# Patient Record
Sex: Female | Born: 1965
Health system: Southern US, Community
[De-identification: ages and names within clinical notes are randomized; demographics above are authoritative.]

## PROBLEM LIST (undated history)

## (undated) DIAGNOSIS — I10 Essential (primary) hypertension: Secondary | ICD-10-CM

## (undated) DIAGNOSIS — K219 Gastro-esophageal reflux disease without esophagitis: Secondary | ICD-10-CM

## (undated) DIAGNOSIS — E039 Hypothyroidism, unspecified: Secondary | ICD-10-CM

## (undated) DIAGNOSIS — F419 Anxiety disorder, unspecified: Secondary | ICD-10-CM

## (undated) DIAGNOSIS — E785 Hyperlipidemia, unspecified: Secondary | ICD-10-CM

## (undated) HISTORY — PX: BLADDER SURGERY: SHX569

## (undated) HISTORY — DX: Anxiety disorder, unspecified: F41.9

## (undated) HISTORY — DX: Essential (primary) hypertension: I10

## (undated) HISTORY — DX: Hypothyroidism, unspecified: E03.9

## (undated) HISTORY — DX: Gastro-esophageal reflux disease without esophagitis: K21.9

## (undated) HISTORY — DX: Hyperlipidemia, unspecified: E78.5

## (undated) HISTORY — PX: CHOLECYSTECTOMY: SHX55

---

## 2003-05-17 ENCOUNTER — Other Ambulatory Visit: Payer: Self-pay

## 2003-06-07 ENCOUNTER — Other Ambulatory Visit: Payer: Self-pay

## 2003-06-16 ENCOUNTER — Other Ambulatory Visit: Payer: Self-pay

## 2005-01-11 ENCOUNTER — Ambulatory Visit: Payer: Self-pay | Admitting: Unknown Physician Specialty

## 2005-07-26 ENCOUNTER — Ambulatory Visit: Payer: Self-pay | Admitting: Unknown Physician Specialty

## 2007-05-31 ENCOUNTER — Emergency Department: Payer: Self-pay | Admitting: Emergency Medicine

## 2008-11-27 ENCOUNTER — Emergency Department: Payer: Self-pay | Admitting: Emergency Medicine

## 2009-03-31 ENCOUNTER — Ambulatory Visit: Payer: Self-pay | Admitting: Family Medicine

## 2010-01-11 ENCOUNTER — Emergency Department: Payer: Self-pay | Admitting: Emergency Medicine

## 2010-03-18 ENCOUNTER — Emergency Department: Payer: Self-pay | Admitting: Emergency Medicine

## 2010-04-30 ENCOUNTER — Ambulatory Visit: Payer: Self-pay | Admitting: Unknown Physician Specialty

## 2010-05-02 ENCOUNTER — Ambulatory Visit: Payer: Self-pay | Admitting: Unknown Physician Specialty

## 2010-05-11 ENCOUNTER — Ambulatory Visit: Payer: Self-pay | Admitting: Unknown Physician Specialty

## 2010-05-11 LAB — HM COLONOSCOPY

## 2011-08-26 ENCOUNTER — Ambulatory Visit: Payer: Self-pay | Admitting: Oncology

## 2011-08-26 LAB — IRON AND TIBC
Iron Bind.Cap.(Total): 441 ug/dL (ref 250–450)
Iron Saturation: 13 %
Iron: 57 ug/dL (ref 50–170)
Unbound Iron-Bind.Cap.: 384 ug/dL

## 2011-08-26 LAB — CBC CANCER CENTER
Basophil #: 0 x10 3/mm (ref 0.0–0.1)
Basophil %: 0.3 %
Eosinophil %: 2 %
HCT: 34.6 % — ABNORMAL LOW (ref 35.0–47.0)
HGB: 11.5 g/dL — ABNORMAL LOW (ref 12.0–16.0)
Lymphocyte #: 2.2 x10 3/mm (ref 1.0–3.6)
Lymphocyte %: 27.7 %
Monocyte #: 0.5 x10 3/mm (ref 0.2–0.9)
Monocyte %: 6.7 %
Platelet: 238 x10 3/mm (ref 150–440)
RBC: 3.8 10*6/uL (ref 3.80–5.20)
RDW: 16.1 % — ABNORMAL HIGH (ref 11.5–14.5)
WBC: 7.9 x10 3/mm (ref 3.6–11.0)

## 2011-08-26 LAB — LACTATE DEHYDROGENASE: LDH: 171 U/L (ref 84–246)

## 2011-08-26 LAB — RETICULOCYTES: Absolute Retic Count: 0.0835 10*6/uL (ref 0.024–0.084)

## 2011-09-14 ENCOUNTER — Ambulatory Visit: Payer: Self-pay | Admitting: Oncology

## 2012-03-27 IMAGING — CT CT ABD-PELV W/ CM
1 of 2 series · 14 of 32 positions shown, 18 images · non-contrast
Comparison: none

REASON FOR EXAM: Epigastric Pain Iron Deficiency Anemia
COMMENTS:

[Series 2: abd with 5.0 i40f · axial · 0.93mm/px · z∈[+419,+814]mm · 14 of 87 slices shown, 18 images]
[im 4/87  soft-tissue]
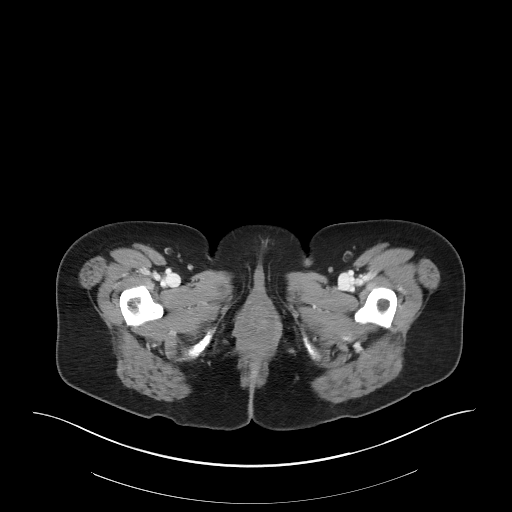
[im 4/87  bone]
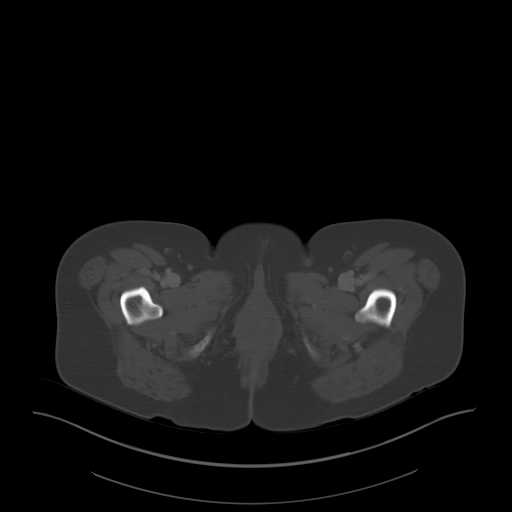
[im 11/87  soft-tissue]
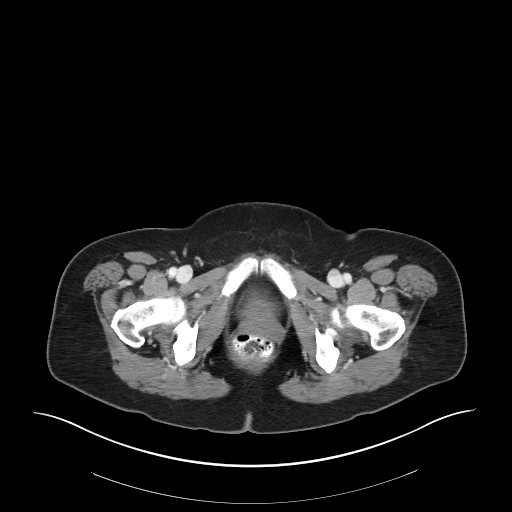
[im 18/87  soft-tissue]
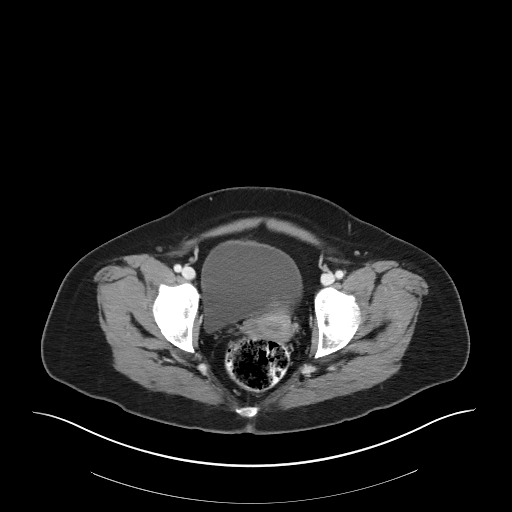
[im 26/87  soft-tissue]
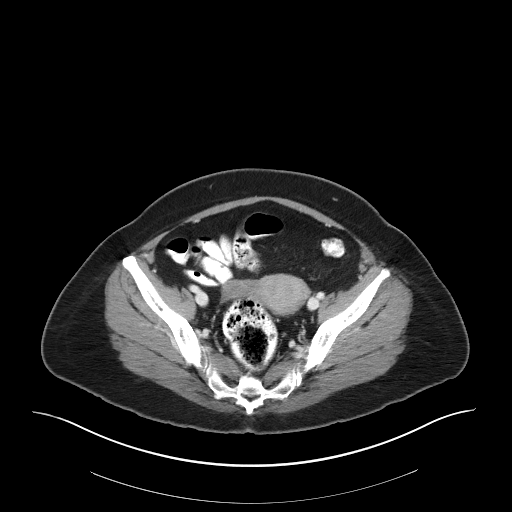
[im 33/87  soft-tissue]
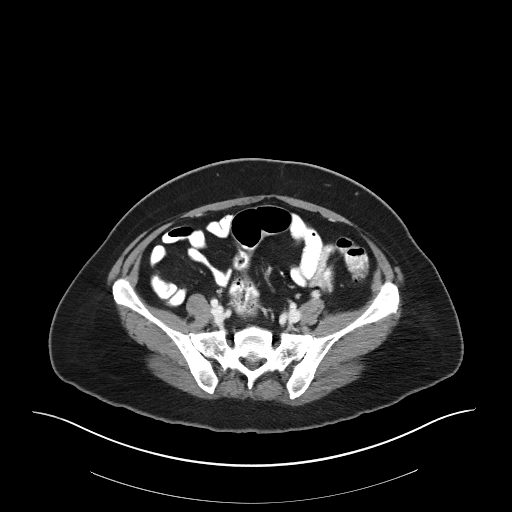
[im 40/87  soft-tissue]
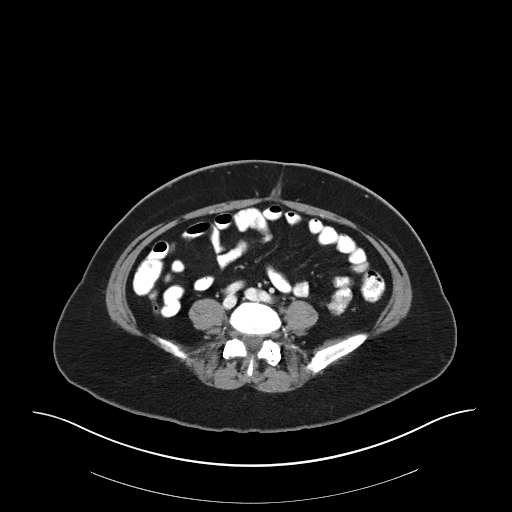
[im 47/87  soft-tissue]
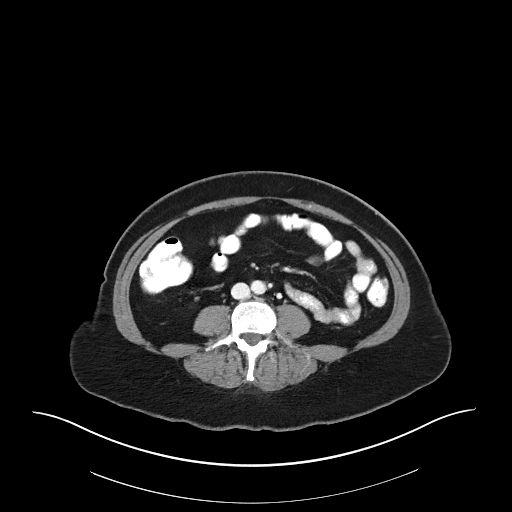
[im 54/87  soft-tissue]
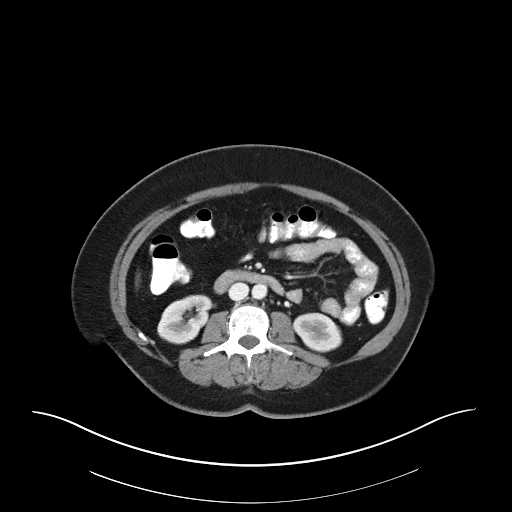
[im 61/87  soft-tissue]
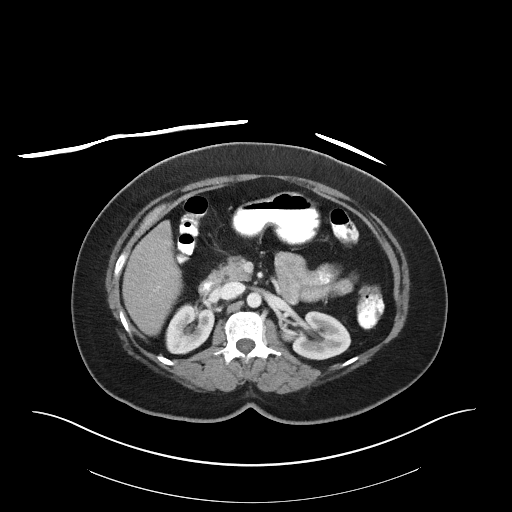
[im 61/87  bone]
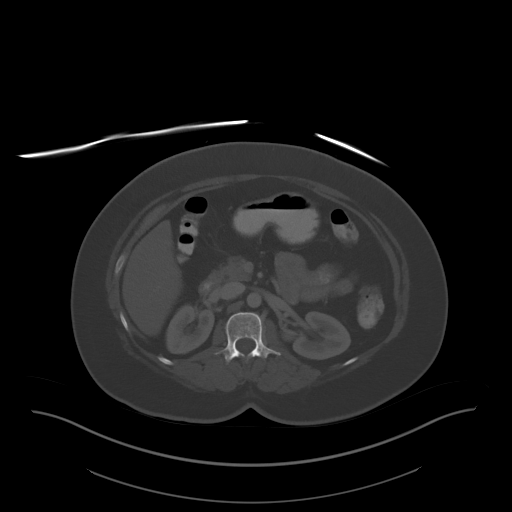
[im 69/87  soft-tissue]
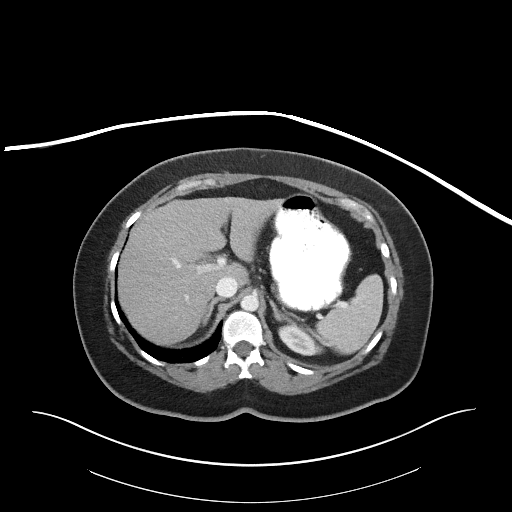
[im 72/87  lung]
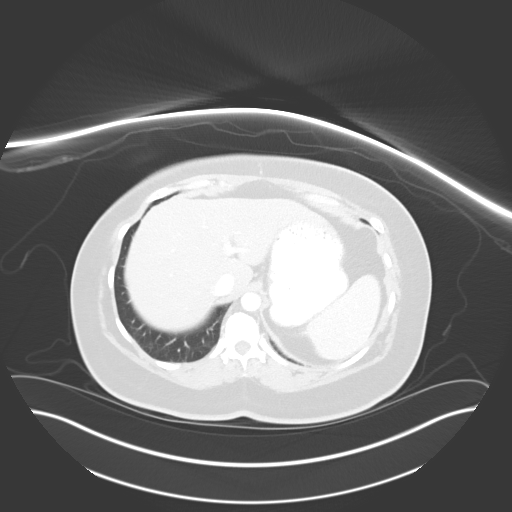
[im 76/87  soft-tissue]
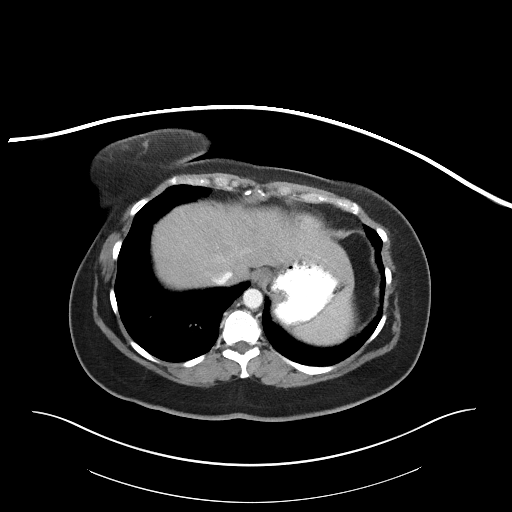
[im 76/87  lung]
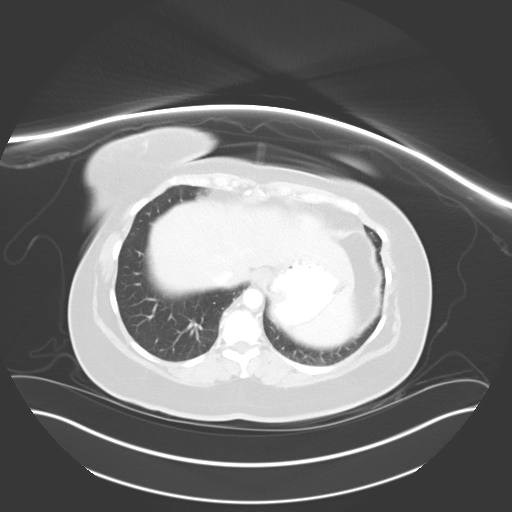
[im 79/87  lung]
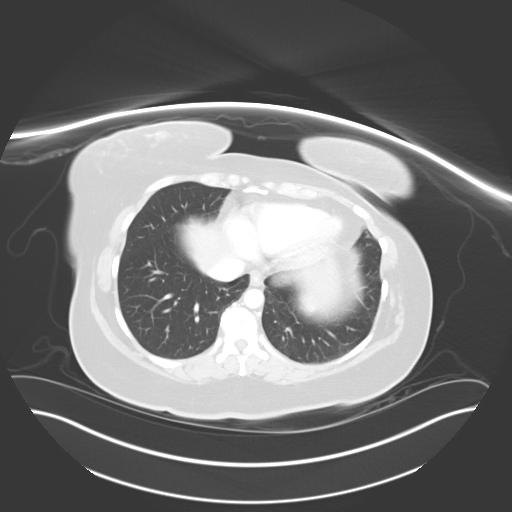
[im 83/87  soft-tissue]
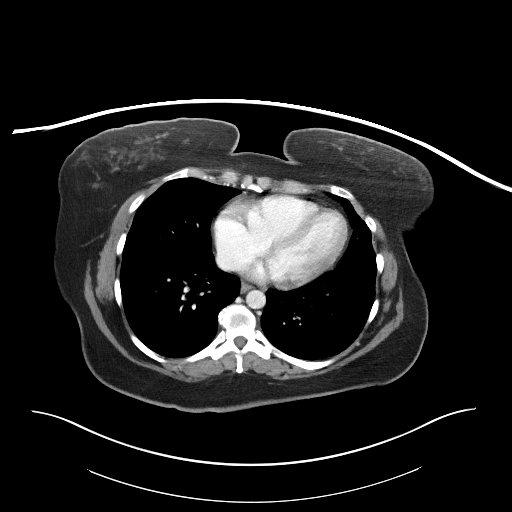
[im 83/87  lung]
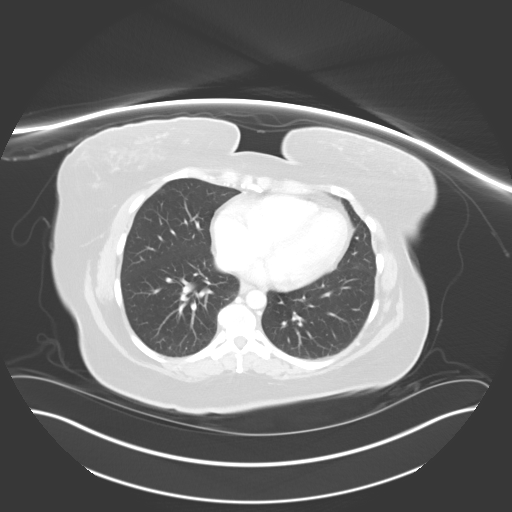

[14 of 32 positions shown; findings below may reference images not displayed]

PROCEDURE:     KCT - KCT ABDOMEN/PELVIS W  - May 02, 2010  [DATE]

RESULT:     Axial CT scanning was performed through the abdomen and pelvis
at 5 mm intervals and slice thicknesses following intravenous administration
of 85 cc of Xsovue-KZT as well as administration of oral contrast material.
Review of multiplanar reconstructed images was performed separately on the
VIA monitor. Comparison is made to a previous study 27 November, 2008.

The liver exhibits normal density with no focal mass nor ductal dilation.
The gallbladder is surgically absent. The spleen, stomach, pancreas, adrenal
glands, and kidneys are normal in appearance. The mesenteric fat exhibits
normal density. The caliber of the abdominal aorta is normal. I see no
periaortic nor pericaval lymphadenopathy. The partially contrast-filled
loops of small and large bowel are normal in appearance. There is a moderate
amount of stool in the rectosigmoid colon.

The uterus exhibits a previously described heterogeneous density mass
arising from the fundus. This is most compatible with a fibroid or group of
fibroids. There is likely an ovarian cyst on the right measuring
approximately 12 x 15 mm. This does not appear new when compared to the
previous study. I see no left adnexal cystic process. There is no free fluid
in the abdomen or pelvis. There is no inguinal nor umbilical hernia. There
is no inguinal lymphadenopathy. The lumbar vertebral bodies are preserved in
height. The lung bases are clear.
IMPRESSION: 1. I do not see evidence of acute hepatobiliary abnormality. The gallbladder
is surgically absent.
2. The stomach and small bowel exhibit no acute abnormality. There is a
moderate amount of stool in the rectosigmoid which may reflect the
clinically described constipation.
3. There is soft tissue fullness associated with the uterine fundus in the
midline and in the right adnexal region. These findings are not new. These
could be evaluated further with pelvic ultrasound. It likely reflect uterine
fibroids and a right adnexal cystic process.
3. I do not see evidence of acute urinary tract abnormality.
4. I see no intra-abdominal nor pelvic lymphadenopathy.

## 2013-01-13 ENCOUNTER — Ambulatory Visit: Payer: Self-pay | Admitting: Unknown Physician Specialty

## 2013-02-10 ENCOUNTER — Ambulatory Visit: Payer: Self-pay | Admitting: Gastroenterology

## 2013-03-03 ENCOUNTER — Ambulatory Visit: Payer: Self-pay | Admitting: Otolaryngology

## 2014-09-18 DIAGNOSIS — K219 Gastro-esophageal reflux disease without esophagitis: Secondary | ICD-10-CM | POA: Insufficient documentation

## 2014-10-07 ENCOUNTER — Encounter: Payer: BLUE CROSS/BLUE SHIELD | Attending: Family Medicine | Admitting: Dietician

## 2014-10-07 ENCOUNTER — Encounter: Payer: Self-pay | Admitting: Dietician

## 2014-10-07 VITALS — Ht 62.0 in | Wt 194.7 lb

## 2014-10-07 DIAGNOSIS — E669 Obesity, unspecified: Secondary | ICD-10-CM | POA: Diagnosis not present

## 2014-10-07 NOTE — Patient Instructions (Signed)
   Look for "0" trans fat on labels, and low Saturated fat. 10g sat. Fat daily or less  Check food labels for sodium, and aim for 600mg  or less with each meal.   Keep working to increase activity and exercise, start with 10 minutes of walking, and gradually increase as your energy increases.  Try using smaller plates to eat meals.  Relax before eating and eat slowly, try chewing each bite 15-20 times before taking another bite.   Increase vegetables -- make sure to include a vegetable with each meal -- such as carrots with a sandwich at lunch, and green beans or broccoli or salad with supper (try Romaine lettuce instead of iceberg, and small seedless cucumbers for easier digestion).

## 2014-10-07 NOTE — Progress Notes (Signed)
Medical Nutrition Therapy: Visit start time: 1100  end time: 1200  Assessment:  Diagnosis: obesity Past medical history: GERD, HTN Psychosocial issues/ stress concerns: patient reports anxiety, peri-menopausal mood changes Preferred learning method:  . Auditory . Visual  Current weight: 194.7lbs  Height: 5'2" Medications, supplements: updated list in chart Progress and evaluation: Patient reports weight gain of 10lbs in past several months. She reports frequent emotional eating and choosing unhealthy snacks over the past several years.         She would like to begin some exercise but feels fatigued and unmotivated when she has the opportunity to exercise.   Physical activity: none  Dietary Intake:  Usual eating pattern includes 3 meals and 2 snacks per day. Dining out frequency: 3 meals per week.  Breakfast: lactaid milk and special K or oatmeal, or graham crackers Snack: sometimes yogurt occasionally cookie Lunch: [place] sandwich with cheese, yogurt with small cup tropical fruit or banana Snack: usually none unless stressed with then eat "junk" ie chips or cookies. Has stopped buying those foods Supper: rice, tortillas, beans, not many vegetables -- larger meal making reflux worse, often eats seconds Snack: none Beverages: water (coffee, soda too acidic)  Nutrition Care Education: Topics covered: weight management, stress eating Basic nutrition: basic food groups, appropriate nutrient balance Weight control:  behavioral changes for weight loss; guide for 1300kcal meal plan with heart healthy food lists; strategies for portion control and snacking Advanced nutrition:  food label reading for Trans fat and saturated fat, sodium Hypertension:  identifying high sodium foods, identifying food sources potassium, magnesium Other lifestyle changes:  increasing motivation for exercise by starting with manageable level and gradually increasing  Nutritional Diagnosis:  Nichols-3.3  Overweight/obesity As related to excess caloric intake, inactivity.  As evidenced by emotional eating, large portions per pt, and BMI of 35.7.  Intervention: Discussion as noted above.    Commended pt for changes she has already made.    Encouraged her to implement strategies discussed to control portions of starches while increasing vegetable portions.   Discussed healthy snack options.    Provided simple tracking tool for her to monitor progress on goal achievement.  Education Materials given:  . Food lists/ Planning A Balanced Meal with 1300kcal plan . Recipes: Hispanic cookbook . Sample meal pattern/ menus: Quick and Healthy Meal Ideas . Goals/ instructions . Other Top 10 healthy diet changes (packet)  Learner/ who was taught:  . Patient   Level of understanding: Marland Kitchen Verbalizes/ demonstrates competency  Demonstrated degree of understanding via:   Teach back Learning barriers: . None  Willingness to learn/ readiness for change: . Eager, change in progress  Monitoring and Evaluation:  Dietary intake, exercise, goal achievement, and body weight      follow up: 11/18/14

## 2014-10-10 ENCOUNTER — Encounter: Payer: Self-pay | Admitting: *Deleted

## 2014-10-13 ENCOUNTER — Encounter: Payer: Self-pay | Admitting: Obstetrics and Gynecology

## 2014-11-08 ENCOUNTER — Ambulatory Visit (INDEPENDENT_AMBULATORY_CARE_PROVIDER_SITE_OTHER): Payer: BLUE CROSS/BLUE SHIELD | Admitting: Obstetrics and Gynecology

## 2014-11-08 ENCOUNTER — Encounter: Payer: Self-pay | Admitting: Obstetrics and Gynecology

## 2014-11-08 VITALS — BP 144/80 | HR 80 | Ht 62.0 in | Wt 200.0 lb

## 2014-11-08 DIAGNOSIS — Z01419 Encounter for gynecological examination (general) (routine) without abnormal findings: Secondary | ICD-10-CM | POA: Diagnosis not present

## 2014-11-08 NOTE — Patient Instructions (Signed)
Thank you for enrolling in MyChart. Please follow the instructions below to securely access your online medical record. MyChart allows you to send messages to your doctor, view your test results, renew your prescriptions, schedule appointments, and more.  How Do I Sign Up? 1. In your Internet browser, go to [email or web address] 2. Click on the New  User? link in the Sign In box.  3. Enter your MyChart Access Code exactly as it appears below. You will not need to use this code after you have completed the sign-up process. If you do not sign up before the expiration date, you must request a new code. MyChart Access Code: [email or web address] Expires: 12/06/2014 12:14 PM  4. Enter the last four digits of your Social Security Number (xxxx) and Date of Birth (mm/dd/yyyy) as indicated and click Next. You will be taken to the next sign-up page. 5. Create a MyChart ID. This will be your MyChart login ID and cannot be changed, so think of one that is secure and easy to remember. 6. Create a MyChart password. You can change your password at any time. 7. Enter your Password Reset Question and Answer and click Next. This can be used at a later time if you forget your password.  8. Select your communication preference, and if applicable enter your e-mail address. You will receive e-mail notification when new information is available in MyChart by choosing to receive e-mail notifications and filling in your e-mail. 9. Click Sign In. You can now view your medical record.   Additional Information If you have questions, you can email [email or web address] or call [phone] to talk to our MyChart staff. Remember, MyChart is NOT to be used for urgent needs. For medical emergencies, dial 911.

## 2014-11-08 NOTE — Progress Notes (Signed)
  Subjective:     Suzanne Griffin is a 49 y.o. female and is here for a comprehensive physical exam. The patient reports no problems.  History   Social History  . Marital Status: Married    Spouse Name: N/A  . Number of Children: N/A  . Years of Education: N/A   Occupational History  . Not on file.   Social History Main Topics  . Smoking status: Never Smoker   . Smokeless tobacco: Never Used  . Alcohol Use: No  . Drug Use: No  . Sexual Activity: Yes   Other Topics Concern  . Not on file   Social History Narrative   Health Maintenance  Topic Date Due  . HIV Screening  04/05/1979  . PAP SMEAR  04/04/1982  . TETANUS/TDAP  04/05/1983  . MAMMOGRAM  04/04/2014  . COLONOSCOPY  04/04/2014  . INFLUENZA VACCINE  11/14/2014    The following portions of the patient's history were reviewed and updated as appropriate: allergies, current medications, past family history, past medical history, past social history, past surgical history and problem list.  Review of Systems A comprehensive review of systems was negative except for: sporadic menses x 1 year- with LMP 05/2014   Objective:    General appearance: alert, cooperative, appears stated age and morbidly obese Neck: no adenopathy, no carotid bruit, no JVD, supple, symmetrical, trachea midline and thyroid not enlarged, symmetric, no tenderness/mass/nodules Lungs: clear to auscultation bilaterally Breasts: normal appearance, no masses or tenderness Heart: regular rate and rhythm, S1, S2 normal, no murmur, click, rub or gallop Abdomen: soft, non-tender; bowel sounds normal; no masses,  no organomegaly Pelvic: cervix normal in appearance, external genitalia normal, no adnexal masses or tenderness, no cervical motion tenderness, rectovaginal septum normal, uterus normal size, shape, and consistency and vagina normal without discharge    Assessment:    Healthy female exam. Obesity; premenopausal; HTN;      Plan:  Pap not  indicated Continue health maintenance and regular exercise MMG ordered RTC 1 year or prn   See After Visit Summary for Counseling Recommendations

## 2014-11-18 ENCOUNTER — Ambulatory Visit: Payer: BLUE CROSS/BLUE SHIELD | Admitting: Dietician

## 2014-12-07 ENCOUNTER — Encounter: Payer: Self-pay | Admitting: Dietician

## 2014-12-21 ENCOUNTER — Ambulatory Visit (INDEPENDENT_AMBULATORY_CARE_PROVIDER_SITE_OTHER): Payer: BLUE CROSS/BLUE SHIELD | Admitting: Family Medicine

## 2014-12-21 ENCOUNTER — Encounter (INDEPENDENT_AMBULATORY_CARE_PROVIDER_SITE_OTHER): Payer: Self-pay

## 2014-12-21 ENCOUNTER — Encounter: Payer: Self-pay | Admitting: Family Medicine

## 2014-12-21 VITALS — BP 140/79 | HR 88 | Temp 97.9°F | Resp 18 | Ht 62.0 in | Wt 198.9 lb

## 2014-12-21 DIAGNOSIS — Z23 Encounter for immunization: Secondary | ICD-10-CM

## 2014-12-21 DIAGNOSIS — E559 Vitamin D deficiency, unspecified: Secondary | ICD-10-CM | POA: Insufficient documentation

## 2014-12-21 NOTE — Progress Notes (Signed)
Name: Suzanne Griffin   MRN: [id number]    DOB: Jan 15, 1966   Date:12/21/2014       Progress Note  Subjective  Chief Complaint  Chief Complaint  Patient presents with  . Follow-up    Fasting - Labs  . Anxiety  . Hyperlipidemia  . Hypertension    HPI Pt. Is here to recheck Vitamin D levels today. Last check was in May 2016 and her levels was 22.6. She denies any muscle aches or fatigue.  Past Medical History  Diagnosis Date  . Anxiety   . Hypertension   . Hyperlipidemia   . Hypothyroidism   . GERD (gastroesophageal reflux disease)     Past Surgical History  Procedure Laterality Date  . Cholecystectomy    . Bladder surgery      Family History  Problem Relation Age of Onset  . Hypertension Father   . Diabetes Maternal Grandmother     Social History   Social History  . Marital Status: Married    Spouse Name: N/A  . Number of Children: N/A  . Years of Education: N/A   Occupational History  . Not on file.   Social History Main Topics  . Smoking status: Never Smoker   . Smokeless tobacco: Never Used  . Alcohol Use: No  . Drug Use: No  . Sexual Activity: Yes   Other Topics Concern  . Not on file   Social History Narrative     Current outpatient prescriptions:  .  ALPRAZolam (XANAX) 0.25 MG tablet, Take 1 tablet by mouth 2 (two) times daily., Disp: , Rfl:  .  atenolol (TENORMIN) 25 MG tablet, Take 50 mg by mouth daily., Disp: , Rfl: 1 .  Cholecalciferol (VITAMIN D3 SUPER STRENGTH) 2000 UNITS TABS, Take 2 tablets by mouth daily., Disp: , Rfl:  .  pantoprazole (PROTONIX) 40 MG tablet, TAKE 1 TABLET BY MOUTH TWICE A DAY (MAKE APPT FOR FURTHER REFILLS), Disp: , Rfl: 1  No Known Allergies   Review of Systems  Constitutional: Negative for fever, chills, weight loss and malaise/fatigue.    Objective  Filed Vitals:   12/21/14 1105  BP: 140/79  Pulse: 88  Temp: 97.9 F (36.6 C)  TempSrc: Oral  Resp: 18  Height:  (1.575 m)  Weight: 198 lb 14.4  oz (90.22 kg)  SpO2: 92%    Physical Exam  Constitutional: She is oriented to person, place, and time and well-developed, well-nourished, and in no distress.  Cardiovascular: Normal rate and regular rhythm.   Pulmonary/Chest: Effort normal and breath sounds normal.  Abdominal: Soft. Bowel sounds are normal.  Neurological: She is alert and oriented to person, place, and time.  Nursing note and vitals reviewed.   Assessment & Plan  1. Need for immunization against influenza Vaccine against influenza is administered.  2. Vitamin D deficiency Recheck levels today and follow-up. - Vitamin D (25 hydroxy)   Reynolds Kittel [facility] 12/21/2014 11:27 AM

## 2014-12-22 LAB — VITAMIN D 25 HYDROXY (VIT D DEFICIENCY, FRACTURES): VIT D 25 HYDROXY: 25.7 ng/mL — AB (ref 30.0–100.0)

## 2014-12-28 ENCOUNTER — Telehealth: Payer: Self-pay | Admitting: Family Medicine

## 2014-12-28 MED ORDER — ATENOLOL 25 MG PO TABS: 50.0000 mg | ORAL_TABLET | Freq: Every day | ORAL | Status: DC

## 2014-12-28 NOTE — Telephone Encounter (Signed)
Atenolol 25 mg has been refilled and sent to [facility] per request

## 2015-01-06 ENCOUNTER — Other Ambulatory Visit: Payer: Self-pay | Admitting: Family Medicine

## 2015-01-06 MED ORDER — VITAMIN D (ERGOCALCIFEROL) 1.25 MG (50000 UNIT) PO CAPS: 50000.0000 [IU] | ORAL_CAPSULE | ORAL | Status: DC

## 2015-01-06 NOTE — Telephone Encounter (Signed)
Viatmin D 50,000 units has been sent to [facility] [address] [address]

## 2015-03-14 ENCOUNTER — Ambulatory Visit: Payer: BLUE CROSS/BLUE SHIELD | Admitting: Family Medicine

## 2015-05-12 ENCOUNTER — Other Ambulatory Visit: Payer: Self-pay | Admitting: Family Medicine

## 2015-07-11 ENCOUNTER — Encounter: Payer: Self-pay | Admitting: Family Medicine

## 2015-07-11 ENCOUNTER — Ambulatory Visit (INDEPENDENT_AMBULATORY_CARE_PROVIDER_SITE_OTHER): Payer: BLUE CROSS/BLUE SHIELD | Admitting: Family Medicine

## 2015-07-11 VITALS — BP 152/90 | HR 82 | Temp 99.0°F | Resp 17 | Ht 62.0 in | Wt 198.0 lb

## 2015-07-11 DIAGNOSIS — I10 Essential (primary) hypertension: Secondary | ICD-10-CM | POA: Insufficient documentation

## 2015-07-11 DIAGNOSIS — F419 Anxiety disorder, unspecified: Secondary | ICD-10-CM | POA: Insufficient documentation

## 2015-07-11 MED ORDER — ALPRAZOLAM 0.25 MG PO TABS: 0.2500 mg | ORAL_TABLET | Freq: Two times a day (BID) | ORAL | Status: DC | PRN

## 2015-07-11 MED ORDER — ATENOLOL 50 MG PO TABS: 50.0000 mg | ORAL_TABLET | Freq: Every day | ORAL | Status: DC

## 2015-07-11 MED ORDER — HYDROCHLOROTHIAZIDE 12.5 MG PO TABS: 12.5000 mg | ORAL_TABLET | Freq: Every day | ORAL | Status: DC

## 2015-07-11 NOTE — Progress Notes (Signed)
Name: Suzanne Griffin   MRN: [id number]    DOB: [birth year]   Date:07/11/2015       Progress Note  Subjective  Chief Complaint  Chief Complaint  Patient presents with  . Annual Exam    CPE    HPI  Hypertension: Blood pressure is steadily increasing, this AM was 136/84 at home, in our office it is 146/84. She has been experiencing hot flashes, no headaches, chest pain, or blurry vision.   Anxiety: Pt. Presents for refill of Alprazolam 0.25 mg twice daily as needed. She has anxiety, feels anxious, nervous, has had panic attacks in the past but not lately. Alprazolam helps relieve her anxiety.   Past Medical History  Diagnosis Date  . Anxiety   . Hypertension   . Hyperlipidemia   . Hypothyroidism   . GERD (gastroesophageal reflux disease)     Past Surgical History  Procedure Laterality Date  . Cholecystectomy    . Bladder surgery      Family History  Problem Relation Age of Onset  . Hypertension Father   . Diabetes Maternal Grandmother     Social History   Social History  . Marital Status: Married    Spouse Name: N/A  . Number of Children: N/A  . Years of Education: N/A   Occupational History  . Not on file.   Social History Main Topics  . Smoking status: Never Smoker   . Smokeless tobacco: Never Used  . Alcohol Use: No  . Drug Use: No  . Sexual Activity: Yes   Other Topics Concern  . Not on file   Social History Narrative     Current outpatient prescriptions:  .  ALPRAZolam (XANAX) 0.25 MG tablet, Take 1 tablet by mouth 2 (two) times daily., Disp: , Rfl:  .  atenolol (TENORMIN) 25 MG tablet, TAKE 2 TABLETS (50 MG TOTAL) BY MOUTH DAILY., Disp: 60 tablet, Rfl: 1 .  Cholecalciferol (VITAMIN D3 SUPER STRENGTH) 2000 UNITS TABS, Take 2 tablets by mouth daily., Disp: , Rfl:  .  pantoprazole (PROTONIX) 40 MG tablet, TAKE 1 TABLET BY MOUTH TWICE A DAY (MAKE APPT FOR FURTHER REFILLS), Disp: , Rfl: 1 .  Vitamin D, Ergocalciferol, (DRISDOL) 50000 UNITS CAPS  capsule, Take 1 capsule (50,000 Units total) by mouth once a week. For 12 weeks (Patient not taking: Reported on 07/11/2015), Disp: 12 capsule, Rfl: 0  No Known Allergies   Review of Systems  Eyes: Negative for blurred vision.  Cardiovascular: Negative for chest pain and palpitations.  Neurological: Negative for headaches.  Psychiatric/Behavioral: The patient is nervous/anxious.      Objective  Filed Vitals:   07/11/15 1100  BP: 146/80  Pulse: 82  Temp: 99 F (37.2 C)  TempSrc: Oral  Resp: 17  Height: 5\' 2"  (1.575 m)  Weight: 198 lb (89.812 kg)  SpO2: 95%    Physical Exam  Constitutional: She is oriented to person, place, and time and well-developed, well-nourished, and in no distress.  HENT:  Head: Normocephalic and atraumatic.  Cardiovascular: Normal rate and regular rhythm.   Pulmonary/Chest: Effort normal and breath sounds normal.  Neurological: She is alert and oriented to person, place, and time.  Psychiatric: Mood, memory, affect and judgment normal.  Nursing note and vitals reviewed.    Assessment & Plan  1. Essential hypertension Blood pressure is elevated, will add hydrochlorothiazide 12.5 mg daily to patient's regimen. Recheck in one month. - hydrochlorothiazide (HYDRODIURIL) 12.5 MG tablet; Take 1 tablet (12.5 mg total) by  mouth daily.  Dispense: 90 tablet; Refill: 0 - atenolol (TENORMIN) 50 MG tablet; Take 1 tablet (50 mg total) by mouth daily.  Dispense: 90 tablet; Refill: 0 - Basic Metabolic Panel (BMET)  2. Anxiety Refill for alprazolam provided. - ALPRAZolam (XANAX) 0.25 MG tablet; Take 1 tablet (0.25 mg total) by mouth 2 (two) times daily as needed for anxiety.  Dispense: 60 tablet; Refill: 0    Dymond Spreen [facility] 07/11/2015 11:21 AM

## 2015-07-12 LAB — BASIC METABOLIC PANEL
BUN / CREAT RATIO: 17 (ref 9–23)
BUN: 13 mg/dL (ref 6–24)
CHLORIDE: 100 mmol/L (ref 96–106)
CO2: 22 mmol/L (ref 18–29)
Calcium: 9.1 mg/dL (ref 8.7–10.2)
Creatinine, Ser: 0.76 mg/dL (ref 0.57–1.00)
GFR calc non Af Amer: 91 mL/min/{1.73_m2} (ref >59)
GFR, EST AFRICAN AMERICAN: 105 mL/min/{1.73_m2} (ref >59)
Glucose: 81 mg/dL (ref 65–99)
Potassium: 4.2 mmol/L (ref 3.5–5.2)
Sodium: 140 mmol/L (ref 134–144)

## 2015-08-17 ENCOUNTER — Encounter: Payer: Self-pay | Admitting: Family Medicine

## 2015-08-17 ENCOUNTER — Ambulatory Visit (INDEPENDENT_AMBULATORY_CARE_PROVIDER_SITE_OTHER): Payer: BLUE CROSS/BLUE SHIELD | Admitting: Family Medicine

## 2015-08-17 VITALS — BP 130/78 | HR 77 | Temp 98.7°F | Resp 20 | Ht 62.0 in | Wt 195.5 lb

## 2015-08-17 DIAGNOSIS — E559 Vitamin D deficiency, unspecified: Secondary | ICD-10-CM | POA: Diagnosis not present

## 2015-08-17 DIAGNOSIS — I10 Essential (primary) hypertension: Secondary | ICD-10-CM

## 2015-08-17 DIAGNOSIS — E785 Hyperlipidemia, unspecified: Secondary | ICD-10-CM

## 2015-08-17 NOTE — Progress Notes (Signed)
Name: Suzanne Griffin   MRN: [id number]    DOB: 1965-04-22   Date:08/17/2015       Progress Note  Subjective  Chief Complaint  Chief Complaint  Patient presents with  . Hypertension    Hypertension This is a chronic problem. The problem is unchanged. The problem is controlled. Pertinent negatives include no blurred vision, chest pain, headaches, palpitations or shortness of breath. Past treatments include beta blockers and diuretics. There are no compliance problems.  There is no history of kidney disease, CAD/MI or CVA.    Past Medical History  Diagnosis Date  . Anxiety   . Hypertension   . Hyperlipidemia   . Hypothyroidism   . GERD (gastroesophageal reflux disease)     Past Surgical History  Procedure Laterality Date  . Cholecystectomy    . Bladder surgery      Family History  Problem Relation Age of Onset  . Hypertension Father   . Diabetes Maternal Grandmother     Social History   Social History  . Marital Status: Married    Spouse Name: N/A  . Number of Children: N/A  . Years of Education: N/A   Occupational History  . Not on file.   Social History Main Topics  . Smoking status: Never Smoker   . Smokeless tobacco: Never Used  . Alcohol Use: No  . Drug Use: No  . Sexual Activity: Yes   Other Topics Concern  . Not on file   Social History Narrative     Current outpatient prescriptions:  .  ALPRAZolam (XANAX) 0.25 MG tablet, Take 1 tablet (0.25 mg total) by mouth 2 (two) times daily as needed for anxiety., Disp: 60 tablet, Rfl: 0 .  atenolol (TENORMIN) 50 MG tablet, Take 1 tablet (50 mg total) by mouth daily., Disp: 90 tablet, Rfl: 0 .  Cholecalciferol (VITAMIN D3 SUPER STRENGTH) 2000 UNITS TABS, Take 2 tablets by mouth daily., Disp: , Rfl:  .  hydrochlorothiazide (HYDRODIURIL) 12.5 MG tablet, Take 1 tablet (12.5 mg total) by mouth daily., Disp: 90 tablet, Rfl: 0 .  pantoprazole (PROTONIX) 40 MG tablet, TAKE 1 TABLET BY MOUTH TWICE A DAY (MAKE APPT  FOR FURTHER REFILLS), Disp: , Rfl: 1 .  Vitamin D, Ergocalciferol, (DRISDOL) 50000 UNITS CAPS capsule, Take 1 capsule (50,000 Units total) by mouth once a week. For 12 weeks (Patient not taking: Reported on 07/11/2015), Disp: 12 capsule, Rfl: 0  No Known Allergies   Review of Systems  Eyes: Negative for blurred vision.  Respiratory: Negative for shortness of breath.   Cardiovascular: Negative for chest pain and palpitations.  Neurological: Negative for headaches.    Objective  Filed Vitals:   08/17/15 0926  BP: 130/78  Pulse: 77  Temp: 98.7 F (37.1 C)  Resp: 20  Height:  (1.575 m)  Weight: 195 lb 8 oz (88.678 kg)  SpO2: 96%    Physical Exam  Constitutional: She is oriented to person, place, and time and well-developed, well-nourished, and in no distress.  HENT:  Head: Normocephalic and atraumatic.  Cardiovascular: Normal rate and regular rhythm.   Pulmonary/Chest: Effort normal and breath sounds normal.  Neurological: She is alert and oriented to person, place, and time.  Psychiatric: Mood, memory, affect and judgment normal.  Nursing note and vitals reviewed.     Assessment & Plan  1. Essential hypertension Blood pressure is improved with the addition of diuretic therapy. Continue.  2. Hyperlipidemia  - Lipid Profile  3. Vitamin D deficiency  -  Vitamin D (25 hydroxy)   Myka Lukins [facility] 08/17/2015 9:40 AM

## 2015-08-18 LAB — LIPID PANEL
Chol/HDL Ratio: 3.9 ratio units (ref 0.0–4.4)
Cholesterol, Total: 222 mg/dL — ABNORMAL HIGH (ref 100–199)
HDL: 57 mg/dL (ref >39)
LDL Calculated: 134 mg/dL — ABNORMAL HIGH (ref 0–99)
Triglycerides: 153 mg/dL — ABNORMAL HIGH (ref 0–149)
VLDL Cholesterol Cal: 31 mg/dL (ref 5–40)

## 2015-08-18 LAB — VITAMIN D 25 HYDROXY (VIT D DEFICIENCY, FRACTURES): VIT D 25 HYDROXY: 39.1 ng/mL (ref 30.0–100.0)

## 2015-10-08 ENCOUNTER — Other Ambulatory Visit: Payer: Self-pay | Admitting: Family Medicine

## 2015-10-10 ENCOUNTER — Other Ambulatory Visit: Payer: Self-pay | Admitting: Family Medicine

## 2015-10-16 ENCOUNTER — Telehealth: Payer: Self-pay | Admitting: Family Medicine

## 2015-10-16 NOTE — Telephone Encounter (Signed)
Called patient and informed her that her rx for alprazolam is ready for pick-up.  Also gave patient her Lipid and Vitamin D results per patient's request.

## 2015-11-17 ENCOUNTER — Ambulatory Visit: Payer: BLUE CROSS/BLUE SHIELD | Admitting: Family Medicine

## 2016-01-09 ENCOUNTER — Other Ambulatory Visit: Payer: Self-pay | Admitting: Family Medicine

## 2016-01-31 ENCOUNTER — Ambulatory Visit (INDEPENDENT_AMBULATORY_CARE_PROVIDER_SITE_OTHER): Payer: BLUE CROSS/BLUE SHIELD | Admitting: Family Medicine

## 2016-01-31 ENCOUNTER — Encounter: Payer: Self-pay | Admitting: Family Medicine

## 2016-01-31 VITALS — BP 133/76 | HR 77 | Temp 99.2°F | Resp 16 | Ht 62.0 in | Wt 183.9 lb

## 2016-01-31 DIAGNOSIS — Z23 Encounter for immunization: Secondary | ICD-10-CM | POA: Diagnosis not present

## 2016-01-31 DIAGNOSIS — I10 Essential (primary) hypertension: Secondary | ICD-10-CM | POA: Diagnosis not present

## 2016-01-31 DIAGNOSIS — F419 Anxiety disorder, unspecified: Secondary | ICD-10-CM | POA: Diagnosis not present

## 2016-01-31 DIAGNOSIS — E785 Hyperlipidemia, unspecified: Secondary | ICD-10-CM | POA: Diagnosis not present

## 2016-01-31 LAB — COMPLETE METABOLIC PANEL WITH GFR
ALBUMIN: 3.9 g/dL (ref 3.6–5.1)
ALK PHOS: 103 U/L (ref 33–130)
ALT: 15 U/L (ref 6–29)
AST: 19 U/L (ref 10–35)
BILIRUBIN TOTAL: 0.6 mg/dL (ref 0.2–1.2)
BUN: 12 mg/dL (ref 7–25)
CO2: 30 mmol/L (ref 20–31)
CREATININE: 0.9 mg/dL (ref 0.50–1.05)
Calcium: 9.2 mg/dL (ref 8.6–10.4)
Chloride: 99 mmol/L (ref 98–110)
GFR, EST NON AFRICAN AMERICAN: 74 mL/min (ref >=60)
GFR, Est African American: 86 mL/min (ref >=60)
GLUCOSE: 91 mg/dL (ref 65–99)
Potassium: 4.4 mmol/L (ref 3.5–5.3)
SODIUM: 136 mmol/L (ref 135–146)
TOTAL PROTEIN: 7.2 g/dL (ref 6.1–8.1)

## 2016-01-31 LAB — LIPID PANEL
Cholesterol: 233 mg/dL — ABNORMAL HIGH (ref 125–200)
HDL: 56 mg/dL (ref >=46)
LDL Cholesterol: 138 mg/dL — ABNORMAL HIGH (ref <130)
Total CHOL/HDL Ratio: 4.2 Ratio (ref <=5.0)
Triglycerides: 197 mg/dL — ABNORMAL HIGH (ref <150)
VLDL: 39 mg/dL — ABNORMAL HIGH (ref <30)

## 2016-01-31 MED ORDER — ATENOLOL 50 MG PO TABS: 50.0000 mg | ORAL_TABLET | Freq: Every day | ORAL | 0 refills | Status: DC

## 2016-01-31 MED ORDER — ALPRAZOLAM 0.25 MG PO TABS: ORAL_TABLET | ORAL | 2 refills | Status: DC

## 2016-01-31 MED ORDER — HYDROCHLOROTHIAZIDE 12.5 MG PO TABS: 12.5000 mg | ORAL_TABLET | Freq: Every day | ORAL | 0 refills | Status: DC

## 2016-01-31 NOTE — Progress Notes (Addendum)
Name: Suzanne Griffin   MRN: [id number]    DOB: Dec 14, 1965   Date:01/31/2016       Progress Note  Subjective  Chief Complaint  Chief Complaint  Patient presents with  . Follow-up    3 mo  . Medication Refill    Hypertension  This is a chronic problem. The problem is unchanged. The problem is controlled. Associated symptoms include anxiety and palpitations. Pertinent negatives include no blurred vision, chest pain, headaches or shortness of breath. Past treatments include beta blockers and diuretics. There are no compliance problems.  There is no history of kidney disease, CAD/MI or CVA.  Anxiety  Presents for follow-up visit. Symptoms include excessive worry, muscle tension, nervous/anxious behavior, palpitations and panic. Patient reports no chest pain or shortness of breath. The severity of symptoms is moderate and causing significant distress (returned from her country of origin after caring for her father who is very ill).      Past Medical History:  Diagnosis Date  . Anxiety   . GERD (gastroesophageal reflux disease)   . Hyperlipidemia   . Hypertension   . Hypothyroidism     Past Surgical History:  Procedure Laterality Date  . BLADDER SURGERY    . CHOLECYSTECTOMY      Family History  Problem Relation Age of Onset  . Hypertension Father   . Diabetes Maternal Grandmother     Social History   Social History  . Marital status: Married    Spouse name: N/A  . Number of children: N/A  . Years of education: N/A   Occupational History  . Not on file.   Social History Main Topics  . Smoking status: Never Smoker  . Smokeless tobacco: Never Used  . Alcohol use No  . Drug use: No  . Sexual activity: Yes   Other Topics Concern  . Not on file   Social History Narrative  . No narrative on file     Current Outpatient Prescriptions:  .  ALPRAZolam (XANAX) 0.25 MG tablet, TAKE 1 TABLET BY MOUTH 2 TIMES DAILY SD NEEDED FOR ANXIETY, Disp: 60 tablet, Rfl: 0 .   atenolol (TENORMIN) 50 MG tablet, TAKE 1 TABLET BY MOUTH EVERY DAY, Disp: 90 tablet, Rfl: 0 .  hydrochlorothiazide (HYDRODIURIL) 12.5 MG tablet, TAKE 1 TABLET BY MOUTH EVERY DAY, Disp: 90 tablet, Rfl: 0 .  pantoprazole (PROTONIX) 40 MG tablet, TAKE 1 TABLET BY MOUTH TWICE A DAY (MAKE APPT FOR FURTHER REFILLS), Disp: , Rfl: 1 .  Cholecalciferol (VITAMIN D3 SUPER STRENGTH) 2000 UNITS TABS, Take 2 tablets by mouth daily., Disp: , Rfl:  .  Vitamin D, Ergocalciferol, (DRISDOL) 50000 UNITS CAPS capsule, Take 1 capsule (50,000 Units total) by mouth once a week. For 12 weeks (Patient not taking: Reported on 01/31/2016), Disp: 12 capsule, Rfl: 0  No Known Allergies   Review of Systems  Eyes: Negative for blurred vision.  Respiratory: Negative for shortness of breath.   Cardiovascular: Positive for palpitations. Negative for chest pain.  Neurological: Negative for headaches.  Psychiatric/Behavioral: The patient is nervous/anxious.     Objective  Vitals:   01/31/16 0750  BP: 133/76  Pulse: 77  Resp: 16  Temp: 99.2 F (37.3 C)  TempSrc: Oral  SpO2: 96%  Weight: 183 lb 14.4 oz (83.4 kg)  Height: 5\' 2"  (1.575 m)    Physical Exam  Constitutional: She is oriented to person, place, and time and well-developed, well-nourished, and in no distress.  HENT:  Head: Normocephalic and atraumatic.  Cardiovascular: Normal rate, regular rhythm, S1 normal, S2 normal and normal heart sounds.   No murmur heard. Pulmonary/Chest: Effort normal and breath sounds normal.  Musculoskeletal:       Right ankle: She exhibits no swelling.       Left ankle: She exhibits no swelling.  Neurological: She is alert and oriented to person, place, and time.  Psychiatric: Mood, memory, affect and judgment normal.  Nursing note and vitals reviewed.    Assessment & Plan  1. Essential hypertension BP stable and controlled on present antihypertensive therapy - hydrochlorothiazide (HYDRODIURIL) 12.5 MG tablet; Take 1  tablet (12.5 mg total) by mouth daily.  Dispense: 90 tablet; Refill: 0 - atenolol (TENORMIN) 50 MG tablet; Take 1 tablet (50 mg total) by mouth daily.  Dispense: 90 tablet; Refill: 0  2. Hyperlipidemia, unspecified hyperlipidemia type Elevated total and LDL cholesterol on lab work from May 2017. Repeat today, conside starting on statin therapy - Lipid Profile - COMPLETE METABOLIC PANEL WITH GFR  3. Anxiety Stable and responsive to alprazolam taken when needed. Refills provided - ALPRAZolam (XANAX) 0.25 MG tablet; TAKE 1 TABLET BY MOUTH 2 TIMES DAILY SD NEEDED FOR ANXIETY  Dispense: 60 tablet; Refill: 2  4. Need for influenza vaccination  - Flu Vaccine QUAD 36+ mos PF IM (Fluarix & Fluzone Quad PF)   Laury Huizar [facility] 01/31/2016 7:56 AM

## 2016-05-06 ENCOUNTER — Ambulatory Visit (INDEPENDENT_AMBULATORY_CARE_PROVIDER_SITE_OTHER): Payer: BLUE CROSS/BLUE SHIELD | Admitting: Family Medicine

## 2016-05-06 ENCOUNTER — Encounter: Payer: Self-pay | Admitting: Family Medicine

## 2016-05-06 VITALS — BP 130/70 | HR 73 | Temp 98.6°F | Resp 16 | Ht 62.0 in | Wt 183.2 lb

## 2016-05-06 DIAGNOSIS — K219 Gastro-esophageal reflux disease without esophagitis: Secondary | ICD-10-CM

## 2016-05-06 DIAGNOSIS — E782 Mixed hyperlipidemia: Secondary | ICD-10-CM | POA: Diagnosis not present

## 2016-05-06 DIAGNOSIS — I10 Essential (primary) hypertension: Secondary | ICD-10-CM

## 2016-05-06 MED ORDER — HYDROCHLOROTHIAZIDE 12.5 MG PO TABS: 12.5000 mg | ORAL_TABLET | Freq: Every day | ORAL | 0 refills | Status: DC

## 2016-05-06 MED ORDER — PANTOPRAZOLE SODIUM 40 MG PO TBEC: 40.0000 mg | DELAYED_RELEASE_TABLET | Freq: Every day | ORAL | 1 refills | Status: DC

## 2016-05-06 NOTE — Progress Notes (Signed)
Name: Suzanne Griffin   MRN: [id number]    DOB: 1965/09/02   Date:05/06/2016       Progress Note  Subjective  Chief Complaint  Chief Complaint  Patient presents with  . Follow-up    Hypertension  This is a chronic problem. The problem is unchanged. The problem is controlled. Pertinent negatives include no blurred vision, chest pain, headaches, palpitations or shortness of breath. Past treatments include beta blockers and diuretics. There are no compliance problems.  There is no history of kidney disease, CAD/MI or CVA.  Gastroesophageal Reflux  She complains of abdominal pain, belching and a sore throat. She reports no chest pain, no coughing, no dysphagia, no heartburn or no nausea. This is a chronic problem. The problem has been unchanged. The symptoms are aggravated by certain foods and stress (chocolate, beans, coffee, sodas.). She has tried a PPI for the symptoms.      Past Medical History:  Diagnosis Date  . Anxiety   . GERD (gastroesophageal reflux disease)   . Hyperlipidemia   . Hypertension   . Hypothyroidism     Past Surgical History:  Procedure Laterality Date  . BLADDER SURGERY    . CHOLECYSTECTOMY      Family History  Problem Relation Age of Onset  . Hypertension Father   . Diabetes Maternal Grandmother     Social History   Social History  . Marital status: Married    Spouse name: N/A  . Number of children: N/A  . Years of education: N/A   Occupational History  . Not on file.   Social History Main Topics  . Smoking status: Never Smoker  . Smokeless tobacco: Never Used  . Alcohol use No  . Drug use: No  . Sexual activity: Yes   Other Topics Concern  . Not on file   Social History Narrative  . No narrative on file     Current Outpatient Prescriptions:  .  ALPRAZolam (XANAX) 0.25 MG tablet, TAKE 1 TABLET BY MOUTH 2 TIMES DAILY SD NEEDED FOR ANXIETY, Disp: 60 tablet, Rfl: 2 .  atenolol (TENORMIN) 50 MG tablet, Take 1 tablet (50 mg total)  by mouth daily., Disp: 90 tablet, Rfl: 0 .  Cholecalciferol (VITAMIN D3 SUPER STRENGTH) 2000 UNITS TABS, Take 2 tablets by mouth daily., Disp: , Rfl:  .  hydrochlorothiazide (HYDRODIURIL) 12.5 MG tablet, Take 1 tablet (12.5 mg total) by mouth daily., Disp: 90 tablet, Rfl: 0 .  pantoprazole (PROTONIX) 40 MG tablet, TAKE 1 TABLET BY MOUTH TWICE A DAY (MAKE APPT FOR FURTHER REFILLS), Disp: , Rfl: 1  No Known Allergies   Review of Systems  HENT: Positive for sore throat.   Eyes: Negative for blurred vision.  Respiratory: Negative for cough and shortness of breath.   Cardiovascular: Negative for chest pain and palpitations.  Gastrointestinal: Positive for abdominal pain. Negative for dysphagia, heartburn and nausea.  Neurological: Negative for headaches.    Objective  Vitals:   05/06/16 0839  BP: 130/70  Pulse: 73  Resp: 16  Temp: 98.6 F (37 C)  TempSrc: Oral  SpO2: 94%  Weight: 183 lb 3.2 oz (83.1 kg)  Height: 5\' 2"  (1.575 m)    Physical Exam  Constitutional: She is oriented to person, place, and time and well-developed, well-nourished, and in no distress.  HENT:  Head: Normocephalic and atraumatic.  Cardiovascular: Normal rate, regular rhythm, S1 normal, S2 normal and normal heart sounds.   No murmur heard. Pulmonary/Chest: Effort normal and breath sounds  normal. She has no wheezes.  Abdominal: Soft. Bowel sounds are normal. There is no tenderness.  Musculoskeletal:       Right ankle: She exhibits no swelling.       Left ankle: She exhibits no swelling.  Neurological: She is alert and oriented to person, place, and time.  Psychiatric: Mood, memory, affect and judgment normal.  Nursing note and vitals reviewed.      Assessment & Plan  1. Essential hypertension  - hydrochlorothiazide (HYDRODIURIL) 12.5 MG tablet; Take 1 tablet (12.5 mg total) by mouth daily.  Dispense: 90 tablet; Refill: 0  2. Gastro-esophageal reflux disease without esophagitis  - pantoprazole  (PROTONIX) 40 MG tablet; Take 1 tablet (40 mg total) by mouth daily.  Dispense: 90 tablet; Refill: 1  3. Mixed hyperlipidemia  - Lipid Profile   Suzanne Griffin Suzanne Griffin [facility] 05/06/2016 9:13 AM

## 2016-07-29 ENCOUNTER — Telehealth: Payer: Self-pay | Admitting: Family Medicine

## 2016-07-29 ENCOUNTER — Other Ambulatory Visit: Payer: Self-pay | Admitting: Emergency Medicine

## 2016-07-29 DIAGNOSIS — I10 Essential (primary) hypertension: Secondary | ICD-10-CM

## 2016-07-29 MED ORDER — HYDROCHLOROTHIAZIDE 12.5 MG PO TABS: 12.5000 mg | ORAL_TABLET | Freq: Every day | ORAL | 0 refills | Status: DC

## 2016-07-29 MED ORDER — ATENOLOL 50 MG PO TABS: 50.0000 mg | ORAL_TABLET | Freq: Every day | ORAL | 0 refills | Status: DC

## 2016-07-29 NOTE — Telephone Encounter (Signed)
Pt had appt for this coming Friday but had to rescheule due to you not being in office. She did resch for 08/08/16. However Atenolol  and hydrochlorothiazide 12.5mg  will be out by tomorrow. Asking that you please send refills to [facility] [address]

## 2016-07-29 NOTE — Telephone Encounter (Signed)
30 day supply sent of each

## 2016-08-02 ENCOUNTER — Ambulatory Visit: Payer: BLUE CROSS/BLUE SHIELD | Admitting: Family Medicine

## 2016-08-08 ENCOUNTER — Ambulatory Visit: Payer: BLUE CROSS/BLUE SHIELD | Admitting: Family Medicine

## 2016-09-09 ENCOUNTER — Other Ambulatory Visit: Payer: Self-pay | Admitting: Family Medicine

## 2016-09-09 DIAGNOSIS — I10 Essential (primary) hypertension: Secondary | ICD-10-CM

## 2016-09-10 NOTE — Telephone Encounter (Signed)
Pt is out of Atenolol and needs [facility] to [facility]. Pt has an appt on 09/12/16

## 2016-09-12 ENCOUNTER — Ambulatory Visit (INDEPENDENT_AMBULATORY_CARE_PROVIDER_SITE_OTHER): Payer: BLUE CROSS/BLUE SHIELD | Admitting: Family Medicine

## 2016-09-12 ENCOUNTER — Encounter: Payer: Self-pay | Admitting: Family Medicine

## 2016-09-12 VITALS — BP 137/79 | HR 78 | Temp 99.2°F | Resp 16 | Ht 62.0 in | Wt 187.5 lb

## 2016-09-12 DIAGNOSIS — E782 Mixed hyperlipidemia: Secondary | ICD-10-CM | POA: Diagnosis not present

## 2016-09-12 DIAGNOSIS — F419 Anxiety disorder, unspecified: Secondary | ICD-10-CM

## 2016-09-12 DIAGNOSIS — I10 Essential (primary) hypertension: Secondary | ICD-10-CM | POA: Diagnosis not present

## 2016-09-12 LAB — LIPID PANEL
CHOL/HDL RATIO: 3.6 ratio (ref <5.0)
Cholesterol: 224 mg/dL — ABNORMAL HIGH (ref <200)
HDL: 63 mg/dL (ref >50)
LDL CALC: 136 mg/dL — AB (ref <100)
Triglycerides: 125 mg/dL (ref <150)
VLDL: 25 mg/dL (ref <30)

## 2016-09-12 LAB — COMPLETE METABOLIC PANEL WITH GFR
ALT: 14 U/L (ref 6–29)
AST: 17 U/L (ref 10–35)
Albumin: 3.9 g/dL (ref 3.6–5.1)
Alkaline Phosphatase: 97 U/L (ref 33–130)
BUN: 14 mg/dL (ref 7–25)
CO2: 24 mmol/L (ref 20–31)
Calcium: 9.1 mg/dL (ref 8.6–10.4)
Chloride: 98 mmol/L (ref 98–110)
Creat: 0.79 mg/dL (ref 0.50–1.05)
GFR, EST NON AFRICAN AMERICAN: 86 mL/min (ref >=60)
GFR, Est African American: >89 mL/min (ref >=60)
GLUCOSE: 87 mg/dL (ref 65–99)
POTASSIUM: 3.8 mmol/L (ref 3.5–5.3)
SODIUM: 137 mmol/L (ref 135–146)
Total Bilirubin: 0.5 mg/dL (ref 0.2–1.2)
Total Protein: 7.1 g/dL (ref 6.1–8.1)

## 2016-09-12 MED ORDER — ALPRAZOLAM 0.25 MG PO TABS: ORAL_TABLET | ORAL | 2 refills | Status: DC

## 2016-09-12 MED ORDER — ATENOLOL 50 MG PO TABS: 50.0000 mg | ORAL_TABLET | Freq: Every day | ORAL | 0 refills | Status: DC

## 2016-09-12 NOTE — Progress Notes (Signed)
Name: Suzanne Griffin   MRN: [id number]    DOB: 1966-03-13   Date:09/12/2016       Progress Note  Subjective  Chief Complaint  Chief Complaint  Patient presents with  . Follow-up    3 mo  . Medication Refill    Hypertension  This is a chronic problem. The problem is unchanged. The problem is controlled. Pertinent negatives include no anxiety, blurred vision, chest pain, headaches, palpitations or shortness of breath. Past treatments include beta blockers and diuretics. There are no compliance problems.  There is no history of kidney disease, CAD/MI or CVA.  Anxiety  Presents for follow-up visit. Symptoms include excessive worry, muscle tension, nervous/anxious behavior and panic. Patient reports no chest pain, palpitations or shortness of breath. The severity of symptoms is moderate and causing significant distress.    Hyperlipidemia  This is a chronic problem. The problem is uncontrolled. Recent lipid tests were reviewed and are high. Pertinent negatives include no chest pain or shortness of breath. Current antihyperlipidemic treatment includes diet change.     Past Medical History:  Diagnosis Date  . Anxiety   . GERD (gastroesophageal reflux disease)   . Hyperlipidemia   . Hypertension   . Hypothyroidism     Past Surgical History:  Procedure Laterality Date  . BLADDER SURGERY    . CHOLECYSTECTOMY      Family History  Problem Relation Age of Onset  . Hypertension Father   . Diabetes Maternal Grandmother     Social History   Social History  . Marital status: Married    Spouse name: N/A  . Number of children: N/A  . Years of education: N/A   Occupational History  . Not on file.   Social History Main Topics  . Smoking status: Never Smoker  . Smokeless tobacco: Never Used  . Alcohol use No  . Drug use: No  . Sexual activity: Yes   Other Topics Concern  . Not on file   Social History Narrative  . No narrative on file     Current Outpatient  Prescriptions:  .  ALPRAZolam (XANAX) 0.25 MG tablet, TAKE 1 TABLET BY MOUTH 2 TIMES DAILY SD NEEDED FOR ANXIETY, Disp: 60 tablet, Rfl: 2 .  atenolol (TENORMIN) 50 MG tablet, TAKE 1 TABLET (50 MG TOTAL) BY MOUTH DAILY., Disp: 90 tablet, Rfl: 0 .  hydrochlorothiazide (HYDRODIURIL) 12.5 MG tablet, Take 1 tablet (12.5 mg total) by mouth daily., Disp: 30 tablet, Rfl: 0 .  pantoprazole (PROTONIX) 40 MG tablet, Take 1 tablet (40 mg total) by mouth daily., Disp: 90 tablet, Rfl: 1 .  Cholecalciferol (VITAMIN D3 SUPER STRENGTH) 2000 UNITS TABS, Take 2 tablets by mouth daily., Disp: , Rfl:   No Known Allergies   Review of Systems  Eyes: Negative for blurred vision.  Respiratory: Negative for shortness of breath.   Cardiovascular: Negative for chest pain and palpitations.  Neurological: Negative for headaches.  Psychiatric/Behavioral: The patient is nervous/anxious.       Objective  Vitals:   09/12/16 0932  BP: 137/79  Pulse: 78  Resp: 16  Temp: 99.2 F (37.3 C)  TempSrc: Oral  SpO2: 95%  Weight: 187 lb 8 oz (85 kg)  Height: 5\' 2"  (1.575 m)    Physical Exam  Constitutional: She is oriented to person, place, and time and well-developed, well-nourished, and in no distress.  HENT:  Head: Normocephalic and atraumatic.  Cardiovascular: Normal rate, regular rhythm, S1 normal, S2 normal and normal heart sounds.  No murmur heard. Pulmonary/Chest: Effort normal and breath sounds normal. She has no wheezes.  Abdominal: Soft. Bowel sounds are normal. There is no tenderness.  Musculoskeletal:       Right ankle: She exhibits no swelling.       Left ankle: She exhibits no swelling.  Neurological: She is alert and oriented to person, place, and time.  Psychiatric: Mood, memory, affect and judgment normal.  Nursing note and vitals reviewed.      Assessment & Plan  1. Anxiety Improved and stable, continue on alprazolam as prescribed - ALPRAZolam (XANAX) 0.25 MG tablet; TAKE 1 TABLET BY  MOUTH 2 TIMES DAILY AS NEEDED FOR ANXIETY  Dispense: 60 tablet; Refill: 2  2. Essential hypertension  - atenolol (TENORMIN) 50 MG tablet; Take 1 tablet (50 mg total) by mouth daily.  Dispense: 90 tablet; Refill: 0  3. Mixed hyperlipidemia Obtain FLP and consider starting on statin - Lipid panel - COMPLETE METABOLIC PANEL WITH GFR   Yaniah Thiemann [facility] 09/12/2016 9:56 AM

## 2016-10-29 ENCOUNTER — Encounter: Payer: Self-pay | Admitting: Family Medicine

## 2016-10-29 ENCOUNTER — Ambulatory Visit (INDEPENDENT_AMBULATORY_CARE_PROVIDER_SITE_OTHER): Payer: BLUE CROSS/BLUE SHIELD | Admitting: Family Medicine

## 2016-10-29 VITALS — BP 134/78 | HR 78 | Temp 98.6°F | Resp 16 | Ht 62.0 in | Wt 188.2 lb

## 2016-10-29 DIAGNOSIS — Z23 Encounter for immunization: Secondary | ICD-10-CM | POA: Diagnosis not present

## 2016-10-29 DIAGNOSIS — Z1159 Encounter for screening for other viral diseases: Secondary | ICD-10-CM

## 2016-10-29 DIAGNOSIS — Z1231 Encounter for screening mammogram for malignant neoplasm of breast: Secondary | ICD-10-CM

## 2016-10-29 DIAGNOSIS — Z780 Asymptomatic menopausal state: Secondary | ICD-10-CM | POA: Diagnosis not present

## 2016-10-29 DIAGNOSIS — Z0000 Encounter for general adult medical examination without abnormal findings: Secondary | ICD-10-CM | POA: Diagnosis not present

## 2016-10-29 DIAGNOSIS — Z1239 Encounter for other screening for malignant neoplasm of breast: Secondary | ICD-10-CM

## 2016-10-29 LAB — TSH: TSH: 0.99 m[IU]/L

## 2016-10-29 NOTE — Progress Notes (Signed)
Name: Suzanne Griffin   MRN: [id number]    DOB: 17-Jul-1965   Date:10/29/2016       Progress Note  Subjective  Chief Complaint  Chief Complaint  Patient presents with  . Annual Exam    CPE    HPI  Pt. Is here for Complete Physical Exam.  She had colonoscopy in June 2013, was asked to rerun in 10 years by GI. Pap smear is done very year by GYN ([facility]). She is due for mammogram, bone density testing, and Hepatitis C screening.    Past Medical History:  Diagnosis Date  . Anxiety   . GERD (gastroesophageal reflux disease)   . Hyperlipidemia   . Hypertension   . Hypothyroidism     Past Surgical History:  Procedure Laterality Date  . BLADDER SURGERY    . CHOLECYSTECTOMY      Family History  Problem Relation Age of Onset  . Hypertension Father   . Diabetes Maternal Grandmother     Social History   Social History  . Marital status: Married    Spouse name: N/A  . Number of children: N/A  . Years of education: N/A   Occupational History  . Not on file.   Social History Main Topics  . Smoking status: Never Smoker  . Smokeless tobacco: Never Used  . Alcohol use No  . Drug use: No  . Sexual activity: Yes   Other Topics Concern  . Not on file   Social History Narrative  . No narrative on file     Current Outpatient Prescriptions:  .  ALPRAZolam (XANAX) 0.25 MG tablet, TAKE 1 TABLET BY MOUTH 2 TIMES DAILY AS NEEDED FOR ANXIETY, Disp: 60 tablet, Rfl: 2 .  atenolol (TENORMIN) 50 MG tablet, Take 1 tablet (50 mg total) by mouth daily., Disp: 90 tablet, Rfl: 0 .  hydrochlorothiazide (HYDRODIURIL) 12.5 MG tablet, Take 1 tablet (12.5 mg total) by mouth daily., Disp: 30 tablet, Rfl: 0 .  pantoprazole (PROTONIX) 40 MG tablet, Take 1 tablet (40 mg total) by mouth daily., Disp: 90 tablet, Rfl: 1 .  Cholecalciferol (VITAMIN D3 SUPER STRENGTH) 2000 UNITS TABS, Take 2 tablets by mouth daily., Disp: , Rfl:   No Known Allergies   Review of Systems  Constitutional:  Negative for chills, fever and malaise/fatigue.  HENT: Negative for congestion, ear pain and sore throat (with acid reflux).   Eyes: Negative for blurred vision and double vision.  Respiratory: Negative for cough and shortness of breath.   Cardiovascular: Negative for chest pain, palpitations and leg swelling.  Gastrointestinal: Positive for heartburn. Negative for abdominal pain (with gastric upset), blood in stool, melena, nausea and vomiting.  Genitourinary: Negative for dysuria and hematuria.  Musculoskeletal: Negative for back pain and neck pain.  Neurological: Negative for dizziness and headaches.  Psychiatric/Behavioral: Positive for depression. The patient is nervous/anxious. The patient does not have insomnia.      Objective  Vitals:   10/29/16 0915  BP: 134/78  Pulse: 78  Resp: 16  Temp: 98.6 F (37 C)  TempSrc: Oral  SpO2: 95%  Weight: 188 lb 3.2 oz (85.4 kg)  Height: 5\' 2"  (1.575 m)    Physical Exam  Constitutional: She is oriented to person, place, and time and well-developed, well-nourished, and in no distress.  HENT:  Head: Normocephalic and atraumatic.  Right Ear: External ear normal.  Left Ear: External ear normal.  Eyes: Pupils are equal, round, and reactive to light. Conjunctivae are normal.  Neck: Neck  supple. No thyromegaly present.  Cardiovascular: Normal rate, regular rhythm and normal heart sounds.   No murmur heard. Pulmonary/Chest: Effort normal and breath sounds normal. She has no wheezes.  Abdominal: Soft. Bowel sounds are normal. There is no tenderness.  Musculoskeletal: She exhibits no edema.  Neurological: She is alert and oriented to person, place, and time.  Psychiatric: Mood, memory, affect and judgment normal.  Nursing note and vitals reviewed.       Assessment & Plan  1. Well woman exam without gynecological exam Obtain age- appropriate laboratory screenings - TSH - VITAMIN D 25 Hydroxy (Vit-D Deficiency, Fractures)  2.  Breast cancer screening  - MM DIGITAL SCREENING BILATERAL; Future  3. Post-menopausal  - DG Bone Density; Future  4. Need for hepatitis C screening test  - Hepatitis C antibody  5. Need for tetanus, diphtheria, and acellular pertussis (Tdap) vaccine  - Tdap vaccine greater than or equal to 7yo IM   Oyuki Hogan [facility] 10/29/2016 9:36 AM

## 2016-10-30 LAB — HEPATITIS C ANTIBODY: HCV Ab: NEGATIVE

## 2016-10-30 LAB — VITAMIN D 25 HYDROXY (VIT D DEFICIENCY, FRACTURES): VIT D 25 HYDROXY: 24 ng/mL — AB (ref 30–100)

## 2016-11-01 ENCOUNTER — Telehealth: Payer: Self-pay

## 2016-11-01 MED ORDER — VITAMIN D (ERGOCALCIFEROL) 1.25 MG (50000 UNIT) PO CAPS: 50000.0000 [IU] | ORAL_CAPSULE | ORAL | 0 refills | Status: DC

## 2016-11-01 NOTE — Telephone Encounter (Signed)
Patient has been notified of lab results and a prescription for vitamin D3 50,000 units take 1 capsule once a week x12 weeks has been [facility] [address] [address] per Dr. Shah, patient has been notified  

## 2016-11-14 ENCOUNTER — Encounter: Payer: BLUE CROSS/BLUE SHIELD | Admitting: Obstetrics and Gynecology

## 2016-11-23 ENCOUNTER — Other Ambulatory Visit: Payer: Self-pay | Admitting: Family Medicine

## 2016-11-23 DIAGNOSIS — I10 Essential (primary) hypertension: Secondary | ICD-10-CM

## 2016-11-23 MED ORDER — HYDROCHLOROTHIAZIDE 12.5 MG PO TABS
12.5000 mg | ORAL_TABLET | Freq: Every day | ORAL | 0 refills | Status: DC
Start: 2016-11-23 — End: 2016-12-24

## 2016-12-09 ENCOUNTER — Other Ambulatory Visit: Payer: Self-pay | Admitting: Family Medicine

## 2016-12-09 DIAGNOSIS — I10 Essential (primary) hypertension: Secondary | ICD-10-CM

## 2016-12-10 ENCOUNTER — Ambulatory Visit: Payer: BLUE CROSS/BLUE SHIELD | Admitting: Family Medicine

## 2016-12-24 ENCOUNTER — Ambulatory Visit (INDEPENDENT_AMBULATORY_CARE_PROVIDER_SITE_OTHER): Payer: BLUE CROSS/BLUE SHIELD | Admitting: Family Medicine

## 2016-12-24 ENCOUNTER — Encounter: Payer: Self-pay | Admitting: Family Medicine

## 2016-12-24 ENCOUNTER — Ambulatory Visit
Admission: RE | Admit: 2016-12-24 | Discharge: 2016-12-24 | Disposition: A | Discharge status: Home / Self Care | Payer: BLUE CROSS/BLUE SHIELD | Source: Ambulatory Visit | Attending: Family Medicine | Admitting: Family Medicine

## 2016-12-24 DIAGNOSIS — F419 Anxiety disorder, unspecified: Secondary | ICD-10-CM

## 2016-12-24 DIAGNOSIS — Z1231 Encounter for screening mammogram for malignant neoplasm of breast: Secondary | ICD-10-CM | POA: Diagnosis not present

## 2016-12-24 DIAGNOSIS — Z780 Asymptomatic menopausal state: Secondary | ICD-10-CM | POA: Insufficient documentation

## 2016-12-24 DIAGNOSIS — I10 Essential (primary) hypertension: Secondary | ICD-10-CM

## 2016-12-24 DIAGNOSIS — Z1239 Encounter for other screening for malignant neoplasm of breast: Secondary | ICD-10-CM

## 2016-12-24 DIAGNOSIS — K219 Gastro-esophageal reflux disease without esophagitis: Secondary | ICD-10-CM | POA: Diagnosis not present

## 2016-12-24 DIAGNOSIS — Z13820 Encounter for screening for osteoporosis: Secondary | ICD-10-CM | POA: Diagnosis not present

## 2016-12-24 MED ORDER — ATENOLOL 50 MG PO TABS: 50.0000 mg | ORAL_TABLET | Freq: Every day | ORAL | 0 refills | Status: DC

## 2016-12-24 MED ORDER — HYDROCHLOROTHIAZIDE 12.5 MG PO TABS: 12.5000 mg | ORAL_TABLET | Freq: Every day | ORAL | 0 refills | Status: DC

## 2016-12-24 MED ORDER — ALPRAZOLAM 0.25 MG PO TABS: ORAL_TABLET | ORAL | 2 refills | Status: DC

## 2016-12-24 MED ORDER — PANTOPRAZOLE SODIUM 20 MG PO TBEC: 20.0000 mg | DELAYED_RELEASE_TABLET | Freq: Every day | ORAL | 0 refills | Status: DC

## 2016-12-24 NOTE — Progress Notes (Signed)
Name: Suzanne Griffin   MRN: [id number]    DOB: February 24, 1966   Date:12/24/2016       Progress Note  Subjective  Chief Complaint  Chief Complaint  Patient presents with  . Follow-up  . Hyperlipidemia  . Hypertension  . Anxiety  . Gastroesophageal Reflux  . Medication Refill    atenolol / hydrochlorothiazide / alprazolam    Hypertension  This is a chronic problem. The problem is unchanged. The problem is controlled. Associated symptoms include anxiety. Pertinent negatives include no blurred vision or palpitations. Past treatments include beta blockers and diuretics. There are no compliance problems.  There is no history of kidney disease, CAD/MI or CVA.  Anxiety  Presents for follow-up visit. Symptoms include excessive worry, muscle tension, nervous/anxious behavior and panic. Patient reports no nausea or palpitations. The severity of symptoms is moderate and causing significant distress.    Gastroesophageal Reflux  She complains of abdominal pain (has been experiencing frequent post meal bowel movements, some loose, other firm,) and belching (after eating). She reports no coughing, no dysphagia, no heartburn or no nausea. This is a chronic problem. The symptoms are aggravated by certain foods. She has tried a PPI and a histamine-2 antagonist (was on Pantoprazole 40 mg daily, then stopped it, now taking Zantac and PeptoBismol.) for the symptoms. Past procedures do not include an EGD or esophageal manometry.      Past Medical History:  Diagnosis Date  . Anxiety   . GERD (gastroesophageal reflux disease)   . Hyperlipidemia   . Hypertension   . Hypothyroidism     Past Surgical History:  Procedure Laterality Date  . BLADDER SURGERY    . CHOLECYSTECTOMY      Family History  Problem Relation Age of Onset  . Hypertension Father   . Diabetes Maternal Grandmother     Social History   Social History  . Marital status: Married    Spouse name: N/A  . Number of children: N/A   . Years of education: N/A   Occupational History  . Not on file.   Social History Main Topics  . Smoking status: Never Smoker  . Smokeless tobacco: Never Used  . Alcohol use No  . Drug use: No  . Sexual activity: Yes   Other Topics Concern  . Not on file   Social History Narrative  . No narrative on file     Current Outpatient Prescriptions:  .  ALPRAZolam (XANAX) 0.25 MG tablet, TAKE 1 TABLET BY MOUTH 2 TIMES DAILY AS NEEDED FOR ANXIETY, Disp: 60 tablet, Rfl: 2 .  atenolol (TENORMIN) 50 MG tablet, Take 1 tablet (50 mg total) by mouth daily., Disp: 90 tablet, Rfl: 0 .  atenolol (TENORMIN) 50 MG tablet, TAKE 1 TABLET BY MOUTH EVERY DAY, Disp: 90 tablet, Rfl: 0 .  Cholecalciferol (VITAMIN D3 SUPER STRENGTH) 2000 UNITS TABS, Take 2 tablets by mouth daily., Disp: , Rfl:  .  hydrochlorothiazide (HYDRODIURIL) 12.5 MG tablet, Take 1 tablet (12.5 mg total) by mouth daily., Disp: 30 tablet, Rfl: 0 .  pantoprazole (PROTONIX) 40 MG tablet, Take 1 tablet (40 mg total) by mouth daily., Disp: 90 tablet, Rfl: 1 .  Vitamin D, Ergocalciferol, (DRISDOL) 50000 units CAPS capsule, Take 1 capsule (50,000 Units total) by mouth once a week. For 12 weeks (Patient not taking: Reported on 12/24/2016), Disp: 12 capsule, Rfl: 0  No Known Allergies   Review of Systems  Eyes: Negative for blurred vision.  Respiratory: Negative for cough.  Cardiovascular: Negative for palpitations.  Gastrointestinal: Positive for abdominal pain (has been experiencing frequent post meal bowel movements, some loose, other firm,). Negative for dysphagia, heartburn and nausea.  Psychiatric/Behavioral: The patient is nervous/anxious.       Objective  Vitals:   12/24/16 1017  BP: 136/79  Pulse: 70  Resp: 16  Temp: 98.9 F (37.2 C)  TempSrc: Oral  SpO2: 96%  Weight: 185 lb 12.8 oz (84.3 kg)  Height:  (1.575 m)    Physical Exam  Constitutional: She is oriented to person, place, and time and well-developed,  well-nourished, and in no distress.  HENT:  Head: Normocephalic and atraumatic.  Cardiovascular: Normal rate, regular rhythm and normal heart sounds.   No murmur heard. Pulmonary/Chest: Effort normal and breath sounds normal. She has no wheezes.  Abdominal: Soft. Bowel sounds are normal. There is no tenderness.  Neurological: She is alert and oriented to person, place, and time.  Psychiatric: Mood, memory, affect and judgment normal.  Nursing note and vitals reviewed.      Assessment & Plan  1. Essential hypertension BP stable on present  treatment - atenolol (TENORMIN) 50 MG tablet; Take 1 tablet (50 mg total) by mouth daily.  Dispense: 90 tablet; Refill: 0 - hydrochlorothiazide (HYDRODIURIL) 12.5 MG tablet; Take 1 tablet (12.5 mg total) by mouth daily.  Dispense: 90 tablet; Refill: 0  2. Anxiety Symptoms of anxiety are responsive to alprazolam taken twice a day when necessary - ALPRAZolam (XANAX) 0.25 MG tablet; TAKE 1 TABLET BY MOUTH 2 TIMES DAILY AS NEEDED FOR ANXIETY  Dispense: 60 tablet; Refill: 2  3. Gastro-esophageal reflux disease without esophagitis Change to Protonix 20 mg daily for treatment of gastroesophageal reflux symptoms occurs Zantac is not effective, loose bowel movements after meals is likely an indication of a viral GI illness. Reassured - pantoprazole (PROTONIX) 20 MG tablet; Take 1 tablet (20 mg total) by mouth daily.  Dispense: 90 tablet; Refill: 0   Taniah Reinecke [facility]ad [facility] 12/24/2016 10:34 AM

## 2016-12-25 ENCOUNTER — Encounter: Payer: BLUE CROSS/BLUE SHIELD | Admitting: Obstetrics and Gynecology

## 2016-12-26 ENCOUNTER — Other Ambulatory Visit: Payer: Self-pay | Admitting: *Deleted

## 2016-12-26 ENCOUNTER — Inpatient Hospital Stay
Admission: RE | Admit: 2016-12-26 | Discharge: 2016-12-26 | Disposition: A | Discharge status: Home / Self Care | Payer: Self-pay | Source: Ambulatory Visit | Attending: *Deleted | Admitting: *Deleted

## 2016-12-26 DIAGNOSIS — Z9289 Personal history of other medical treatment: Secondary | ICD-10-CM

## 2017-01-03 ENCOUNTER — Encounter: Payer: BLUE CROSS/BLUE SHIELD | Admitting: Obstetrics and Gynecology

## 2017-03-20 ENCOUNTER — Other Ambulatory Visit: Payer: Self-pay | Admitting: Obstetrics and Gynecology

## 2017-03-20 ENCOUNTER — Ambulatory Visit (INDEPENDENT_AMBULATORY_CARE_PROVIDER_SITE_OTHER): Payer: BLUE CROSS/BLUE SHIELD | Admitting: Obstetrics and Gynecology

## 2017-03-20 ENCOUNTER — Encounter: Payer: Self-pay | Admitting: Obstetrics and Gynecology

## 2017-03-20 VITALS — HR 68 | Ht 62.0 in | Wt 183.0 lb

## 2017-03-20 DIAGNOSIS — Z01419 Encounter for gynecological examination (general) (routine) without abnormal findings: Secondary | ICD-10-CM | POA: Diagnosis not present

## 2017-03-20 DIAGNOSIS — Z23 Encounter for immunization: Secondary | ICD-10-CM | POA: Diagnosis not present

## 2017-03-20 NOTE — Progress Notes (Signed)
Subjective:   Suzanne Griffin is a 51 y.o. No obstetric history on file. Hispanic female here for a routine well-woman exam.  No LMP recorded. Patient is postmenopausal.    Current complaints: occasional leaking of urine when trying to make it to the bathroom. 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 Tobacco/alcohol: no tobacco use Illicit drugs: no history of illicit drug use  The following portions of the patient's history were reviewed and updated as appropriate: allergies, current medications, past family history, past medical history, past social history, past surgical history and problem list.  Past Medical History Past Medical History:  Diagnosis Date  . Anxiety   . GERD (gastroesophageal reflux disease)   . Hyperlipidemia   . Hypertension   . Hypothyroidism     Past Surgical History Past Surgical History:  Procedure Laterality Date  . BLADDER SURGERY    . CHOLECYSTECTOMY      Gynecologic History No obstetric history on file.  No LMP recorded. Patient is postmenopausal. Contraception: post menopausal status Last Pap: 2014. Results were: normal Last mammogram: 2018. Results were: normal   Obstetric History OB History  No data available    Current Medications Current Outpatient Medications on File Prior to Visit  Medication Sig Dispense Refill  . ALPRAZolam (XANAX) 0.25 MG tablet TAKE 1 TABLET BY MOUTH 2 TIMES DAILY AS NEEDED FOR ANXIETY 60 tablet 2  . atenolol (TENORMIN) 50 MG tablet Take 1 tablet (50 mg total) by mouth daily. 90 tablet 0  . Cholecalciferol (VITAMIN D3 SUPER STRENGTH) 2000 UNITS TABS Take 2 tablets by mouth daily.    . hydrochlorothiazide (HYDRODIURIL) 12.5 MG tablet Take 1 tablet (12.5 mg total) by mouth daily. 90 tablet 0  . pantoprazole (PROTONIX) 20 MG tablet Take 1 tablet (20 mg total) by mouth daily. 90 tablet 0  .  Vitamin D, Ergocalciferol, (DRISDOL) 50000 units CAPS capsule Take 1 capsule (50,000 Units total) by mouth once a week. For 12 weeks (Patient not taking: Reported on 12/24/2016) 12 capsule 0   No current facility-administered medications on file prior to visit.     Review of Systems Patient denies any headaches, blurred vision, shortness of breath, chest pain, abdominal pain, problems with bowel movements, urination, or intercourse.  Objective:  Pulse 68   Ht 5\' 2"  (1.575 m)   Wt 183 lb (83 kg)   BMI 33.47 kg/m  Physical Exam  General:  Well developed, well nourished, no acute distress. She is alert and oriented x3. Skin:  Warm and dry Neck:  Midline trachea, no thyromegaly or nodules Cardiovascular: Regular rate and rhythm, no murmur heard Lungs:  Effort normal, all lung fields clear to auscultation bilaterally Breasts:  No dominant palpable mass, retraction, or nipple discharge Abdomen:  Soft, non tender, no hepatosplenomegaly or masses Pelvic:  External genitalia is normal in appearance.  The vagina is normal in appearance. The cervix is bulbous, no CMT.  Thin prep pap is done with HR HPV cotesting. Uterus is felt to be normal size, shape, and contour.  No adnexal masses or tenderness noted. Extremities:  No swelling or varicosities noted Psych:  She has a normal mood and affect  Assessment:   Healthy well-woman exam Urge incontenance Needs flu vaccine  Plan:  Flu vaccine given Discussed Kegel exercises. F/U 1 year for AE, or sooner if needed Mammogram ordered or sooner if problems  Colonoscopy done 2 years ago  Melody Suzan NailerN [place], [place]

## 2017-03-21 LAB — CYTOLOGY - PAP

## 2017-03-25 ENCOUNTER — Encounter: Payer: Self-pay | Admitting: Family Medicine

## 2017-03-25 ENCOUNTER — Ambulatory Visit (INDEPENDENT_AMBULATORY_CARE_PROVIDER_SITE_OTHER): Payer: BLUE CROSS/BLUE SHIELD | Admitting: Family Medicine

## 2017-03-25 VITALS — BP 136/86 | HR 78 | Temp 98.3°F | Resp 14 | Wt 184.5 lb

## 2017-03-25 DIAGNOSIS — E7800 Pure hypercholesterolemia, unspecified: Secondary | ICD-10-CM

## 2017-03-25 DIAGNOSIS — I10 Essential (primary) hypertension: Secondary | ICD-10-CM

## 2017-03-25 DIAGNOSIS — F419 Anxiety disorder, unspecified: Secondary | ICD-10-CM

## 2017-03-25 DIAGNOSIS — K219 Gastro-esophageal reflux disease without esophagitis: Secondary | ICD-10-CM | POA: Diagnosis not present

## 2017-03-25 MED ORDER — ALPRAZOLAM 0.25 MG PO TABS: ORAL_TABLET | ORAL | 2 refills | Status: DC

## 2017-03-25 MED ORDER — PANTOPRAZOLE SODIUM 20 MG PO TBEC: 20.0000 mg | DELAYED_RELEASE_TABLET | Freq: Every day | ORAL | 0 refills | Status: DC

## 2017-03-25 MED ORDER — ATENOLOL 50 MG PO TABS: 50.0000 mg | ORAL_TABLET | Freq: Every day | ORAL | 0 refills | Status: DC

## 2017-03-25 MED ORDER — HYDROCHLOROTHIAZIDE 12.5 MG PO TABS: 12.5000 mg | ORAL_TABLET | Freq: Every day | ORAL | 0 refills | Status: DC

## 2017-03-25 NOTE — Progress Notes (Signed)
Name: Suzanne Griffin   MRN: [id number]    DOB: [birth year]   Date:03/26/2017       Progress Note  Subjective  Chief Complaint  Chief Complaint  Patient presents with  . Hypertension    f/u  . Gastroesophageal Reflux    f/u  . Hyperlipidemia    with fasting labs  . Medication Refill    Hypertension  This is a chronic problem. The problem is unchanged. The problem is controlled. Pertinent negatives include no blurred vision, chest pain, headaches, palpitations or shortness of breath. Past treatments include beta blockers and diuretics. There are no compliance problems.  There is no history of kidney disease, CAD/MI or CVA.  Gastroesophageal Reflux  She reports no belching, no chest pain, no choking, no heartburn or no nausea. This is a chronic problem. The symptoms are aggravated by lying down. She has tried a PPI for the symptoms. Past procedures do not include an EGD.  Hyperlipidemia  This is a chronic problem. The problem is uncontrolled. Recent lipid tests were reviewed and are high. Pertinent negatives include no chest pain or shortness of breath. Current antihyperlipidemic treatment includes diet change.  Anxiety  Presents for follow-up visit. Symptoms include excessive worry, muscle tension, nervous/anxious behavior and panic. Patient reports no chest pain, nausea, palpitations or shortness of breath. The severity of symptoms is moderate and causing significant distress.       Past Medical History:  Diagnosis Date  . Anxiety   . GERD (gastroesophageal reflux disease)   . Hyperlipidemia   . Hypertension   . Hypothyroidism     Past Surgical History:  Procedure Laterality Date  . BLADDER SURGERY    . CHOLECYSTECTOMY      Family History  Problem Relation Age of Onset  . Hypertension Father   . Diabetes Maternal Grandmother   . Breast cancer Neg Hx     Social History   Socioeconomic History  . Marital status: Married    Spouse name: Not on file  . Number of  children: Not on file  . Years of education: Not on file  . Highest education level: Not on file  Social Needs  . Financial resource strain: Not on file  . Food insecurity - worry: Not on file  . Food insecurity - inability: Not on file  . Transportation needs - medical: Not on file  . Transportation needs - non-medical: Not on file  Occupational History  . Not on file  Tobacco Use  . Smoking status: Never Smoker  . Smokeless tobacco: Never Used  Substance and Sexual Activity  . Alcohol use: No    Alcohol/week: 0.0 oz  . Drug use: No  . Sexual activity: Yes  Other Topics Concern  . Not on file  Social History Narrative  . Not on file     Current Outpatient Medications:  .  ALPRAZolam (XANAX) 0.25 MG tablet, TAKE 1 TABLET BY MOUTH 2 TIMES DAILY AS NEEDED FOR ANXIETY, Disp: 60 tablet, Rfl: 2 .  atenolol (TENORMIN) 50 MG tablet, Take 1 tablet (50 mg total) by mouth daily., Disp: 90 tablet, Rfl: 0 .  Cholecalciferol (VITAMIN D3 SUPER STRENGTH) 2000 UNITS TABS, Take 2 tablets by mouth daily., Disp: , Rfl:  .  hydrochlorothiazide (HYDRODIURIL) 12.5 MG tablet, Take 1 tablet (12.5 mg total) by mouth daily., Disp: 90 tablet, Rfl: 0 .  pantoprazole (PROTONIX) 20 MG tablet, Take 1 tablet (20 mg total) by mouth daily., Disp: 90 tablet, Rfl: 0 .  vitamin B-12 (CYANOCOBALAMIN) 1000 MCG tablet, Take 1,000 mcg by mouth daily., Disp: , Rfl:  .  Vitamin D, Ergocalciferol, (DRISDOL) 50000 units CAPS capsule, Take 1 capsule (50,000 Units total) by mouth once a week. For 12 weeks (Patient not taking: Reported on 12/24/2016), Disp: 12 capsule, Rfl: 0  No Known Allergies   Review of Systems  Eyes: Negative for blurred vision.  Respiratory: Negative for choking and shortness of breath.   Cardiovascular: Negative for chest pain and palpitations.  Gastrointestinal: Negative for heartburn and nausea.  Neurological: Negative for headaches.  Psychiatric/Behavioral: The patient is nervous/anxious.       Objective  Vitals:   03/26/17 1020  BP: 136/86  Pulse: 78  Resp: 14  Temp: 98.3 F (36.8 C)  TempSrc: Oral  SpO2: 99%  Weight: 184 lb 8 oz (83.7 kg)    Physical Exam  Constitutional: She is oriented to person, place, and time and well-developed, well-nourished, and in no distress.  HENT:  Head: Normocephalic and atraumatic.  Cardiovascular: Normal rate, regular rhythm, S1 normal, S2 normal and normal heart sounds.  No murmur heard. Pulmonary/Chest: Effort normal and breath sounds normal. She has no wheezes.  Abdominal: Soft. Bowel sounds are normal. There is no tenderness.  Musculoskeletal:       Right ankle: She exhibits no swelling.       Left ankle: She exhibits no swelling.  Neurological: She is alert and oriented to person, place, and time.  Psychiatric: Mood, memory, affect and judgment normal.  Nursing note and vitals reviewed.    Assessment & Plan  1. Essential hypertension BP stable on present anti-hypertensive treatment - atenolol (TENORMIN) 50 MG tablet; Take 1 tablet (50 mg total) by mouth daily.  Dispense: 90 tablet; Refill: 0 - hydrochlorothiazide (HYDRODIURIL) 12.5 MG tablet; Take 1 tablet (12.5 mg total) by mouth daily.  Dispense: 90 tablet; Refill: 0  2. Anxiety In terms of anxiety are stable and in remission on alprazolam taken up to twice daily as needed, and advised of my impending departure from practice and the need to seek either a new PCP or a psychiatrist for management of anxiety - ALPRAZolam (XANAX) 0.25 MG tablet; TAKE 1 TABLET BY MOUTH 2 TIMES DAILY AS NEEDED FOR ANXIETY  Dispense: 60 tablet; Refill: 2  3. Gastro-esophageal reflux disease without esophagitis  - pantoprazole (PROTONIX) 20 MG tablet; Take 1 tablet (20 mg total) by mouth daily.  Dispense: 90 tablet; Refill: 0  4. Pure hypercholesterolemia  - Lipid panel   Leandrew Keech Asad A. Faylene KurtzShah [facility] 03/26/2017 11:13 AM

## 2017-04-23 DIAGNOSIS — J40 Bronchitis, not specified as acute or chronic: Secondary | ICD-10-CM | POA: Diagnosis not present

## 2017-04-23 DIAGNOSIS — J019 Acute sinusitis, unspecified: Secondary | ICD-10-CM | POA: Diagnosis not present

## 2017-04-23 DIAGNOSIS — B9689 Other specified bacterial agents as the cause of diseases classified elsewhere: Secondary | ICD-10-CM | POA: Diagnosis not present

## 2017-05-12 ENCOUNTER — Other Ambulatory Visit: Payer: Self-pay

## 2017-05-12 DIAGNOSIS — I10 Essential (primary) hypertension: Secondary | ICD-10-CM

## 2017-05-12 MED ORDER — ATENOLOL 50 MG PO TABS: 50.0000 mg | ORAL_TABLET | Freq: Every day | ORAL | 0 refills | Status: DC

## 2017-06-23 ENCOUNTER — Encounter: Payer: Self-pay | Admitting: Family Medicine

## 2017-06-23 ENCOUNTER — Ambulatory Visit (INDEPENDENT_AMBULATORY_CARE_PROVIDER_SITE_OTHER): Payer: BLUE CROSS/BLUE SHIELD | Admitting: Family Medicine

## 2017-06-23 VITALS — BP 138/90 | HR 83 | Temp 98.7°F | Resp 16 | Ht 62.0 in | Wt 180.8 lb

## 2017-06-23 DIAGNOSIS — Z131 Encounter for screening for diabetes mellitus: Secondary | ICD-10-CM | POA: Diagnosis not present

## 2017-06-23 DIAGNOSIS — T22111A Burn of first degree of right forearm, initial encounter: Secondary | ICD-10-CM | POA: Diagnosis not present

## 2017-06-23 DIAGNOSIS — I10 Essential (primary) hypertension: Secondary | ICD-10-CM | POA: Diagnosis not present

## 2017-06-23 DIAGNOSIS — F419 Anxiety disorder, unspecified: Secondary | ICD-10-CM | POA: Diagnosis not present

## 2017-06-23 DIAGNOSIS — E559 Vitamin D deficiency, unspecified: Secondary | ICD-10-CM

## 2017-06-23 DIAGNOSIS — J302 Other seasonal allergic rhinitis: Secondary | ICD-10-CM

## 2017-06-23 DIAGNOSIS — E7800 Pure hypercholesterolemia, unspecified: Secondary | ICD-10-CM

## 2017-06-23 DIAGNOSIS — J3089 Other allergic rhinitis: Secondary | ICD-10-CM | POA: Diagnosis not present

## 2017-06-23 DIAGNOSIS — K219 Gastro-esophageal reflux disease without esophagitis: Secondary | ICD-10-CM

## 2017-06-23 MED ORDER — PANTOPRAZOLE SODIUM 20 MG PO TBEC: 20.0000 mg | DELAYED_RELEASE_TABLET | Freq: Every day | ORAL | 1 refills | Status: DC

## 2017-06-23 MED ORDER — ATENOLOL 50 MG PO TABS: 50.0000 mg | ORAL_TABLET | Freq: Every day | ORAL | 1 refills | Status: DC

## 2017-06-23 MED ORDER — CETIRIZINE HCL 10 MG PO TABS
10.0000 mg | ORAL_TABLET | Freq: Every day | ORAL | 1 refills | Status: DC
Start: 2017-06-23 — End: 2017-12-11

## 2017-06-23 MED ORDER — HYDROXYZINE HCL 10 MG PO TABS: 10.0000 mg | ORAL_TABLET | Freq: Every day | ORAL | 0 refills | Status: DC | PRN

## 2017-06-23 MED ORDER — SILVER SULFADIAZINE 1 % EX CREA: 1.0000 "application " | TOPICAL_CREAM | Freq: Every day | CUTANEOUS | 0 refills | Status: DC

## 2017-06-23 MED ORDER — HYDROCHLOROTHIAZIDE 12.5 MG PO TABS: 12.5000 mg | ORAL_TABLET | Freq: Every day | ORAL | 1 refills | Status: DC

## 2017-06-23 NOTE — Progress Notes (Signed)
Name: Suzanne Griffin   MRN: [id number]    DOB: October 21, 1965   Date:06/23/2017       Progress Note  Subjective  Chief Complaint  Chief Complaint  Patient presents with  . Hyperlipidemia  . Hypertension    HPI  HTN: she has been taking HCTZ and Atenolol, bp is usually at goal, but DBP is 90 today. No chest pain or palpitation   Hyperlipidemia: she is due for labs, not on medication, discussed ways to improved triglycerides and also LDL   GERD: she is taking low dose Pantoprazole, she still has episodes of heartburn and regurgitation twice a week when she eats something she is not supposed to.   Anxiety: she states takes alprazolam prn and has enough at home, discussed switching to hydroxyzine.   Vitamin D deficiency: she is taking otc supplementation   Patient Active Problem List   Diagnosis Date Noted  . Hyperlipidemia 08/17/2015  . Hypertension 07/11/2015  . Anxiety 07/11/2015  . Vitamin D deficiency 12/21/2014  . Gastro-esophageal reflux disease without esophagitis 09/18/2014    Past Surgical History:  Procedure Laterality Date  . BLADDER SURGERY    . CHOLECYSTECTOMY      Family History  Problem Relation Age of Onset  . Hypertension Father   . Diabetes Maternal Grandmother   . Breast cancer Neg Hx     Social History   Socioeconomic History  . Marital status: Married    Spouse name: Not on file  . Number of children: Not on file  . Years of education: Not on file  . Highest education level: Not on file  Social Needs  . Financial resource strain: Not on file  . Food insecurity - worry: Not on file  . Food insecurity - inability: Not on file  . Transportation needs - medical: Not on file  . Transportation needs - non-medical: Not on file  Occupational History  . Not on file  Tobacco Use  . Smoking status: Never Smoker  . Smokeless tobacco: Never Used  Substance and Sexual Activity  . Alcohol use: No    Alcohol/week: 0.0 oz  . Drug use: No  . Sexual  activity: Yes  Other Topics Concern  . Not on file  Social History Narrative  . Not on file     Current Outpatient Medications:  .  ALPRAZolam (XANAX) 0.25 MG tablet, TAKE 1 TABLET BY MOUTH 2 TIMES DAILY AS NEEDED FOR ANXIETY, Disp: 60 tablet, Rfl: 2 .  atenolol (TENORMIN) 50 MG tablet, Take 1 tablet (50 mg total) by mouth daily., Disp: 90 tablet, Rfl: 1 .  Cholecalciferol (VITAMIN D3 SUPER STRENGTH) 2000 UNITS TABS, Take 2 tablets by mouth daily., Disp: , Rfl:  .  hydrochlorothiazide (HYDRODIURIL) 12.5 MG tablet, Take 1 tablet (12.5 mg total) by mouth daily., Disp: 90 tablet, Rfl: 1 .  pantoprazole (PROTONIX) 20 MG tablet, Take 1 tablet (20 mg total) by mouth daily., Disp: 90 tablet, Rfl: 1 .  vitamin B-12 (CYANOCOBALAMIN) 1000 MCG tablet, Take 1,000 mcg by mouth daily., Disp: , Rfl:  .  hydrOXYzine (ATARAX/VISTARIL) 10 MG tablet, Take 1 tablet (10 mg total) by mouth daily as needed., Disp: 30 tablet, Rfl: 0  No Known Allergies   ROS  Constitutional: Negative for fever or weight change.  Respiratory: Negative for cough and shortness of breath.   Cardiovascular: Negative for chest pain or palpitations.  Gastrointestinal: Negative for abdominal pain, no bowel changes.  Musculoskeletal: Negative for gait problem or joint swelling.  Skin: Negative for rash.  Neurological: Negative for dizziness , frontal  Headache - pressure.  No other specific complaints in a complete review of systems (except as listed in HPI above).  Objective  Vitals:   06/23/17 1535  BP: 138/90  Pulse: 83  Resp: 16  Temp: 98.7 F (37.1 C)  TempSrc: Oral  SpO2: 96%  Weight: 180 lb 12.8 oz (82 kg)  Height: 5\' 2"  (1.575 m)    Body mass index is 33.07 kg/m.  Physical Exam  Constitutional: Patient appears well-developed and well-nourished. Obese No distress.  HEENT: head atraumatic, normocephalic, pupils equal and reactive to light,  neck supple, throat within normal limits Cardiovascular: Normal  rate, regular rhythm and normal heart sounds.  No murmur heard. No BLE edema. Pulmonary/Chest: Effort normal and breath sounds normal. No respiratory distress. Abdominal: Soft.  There is no tenderness. Psychiatric: Patient has a normal mood and affect. behavior is normal. Judgment and thought content normal. Skin: superficial burn right forearm, from hot over rack   PHQ2/9: Depression screen [facility] 2/9 03/25/2017 12/24/2016 10/29/2016 09/12/2016 01/31/2016  Decreased Interest 0 0 0 0 0  Down, Depressed, Hopeless 0 0 0 0 0  PHQ - 2 Score 0 0 0 0 0  Altered sleeping - - - - -  Tired, decreased energy - - - - -  Change in appetite - - - - -  Feeling bad or failure about yourself  - - - - -  Trouble concentrating - - - - -  Moving slowly or fidgety/restless - - - - -  Suicidal thoughts - - - - -  PHQ-9 Score - - - - -  Difficult doing work/chores - - - - -     Fall Risk: Fall Risk  06/23/2017 03/25/2017 12/24/2016 10/29/2016 09/12/2016  Falls in the past year? No No No No No     Functional Status Survey: Is the patient deaf or have difficulty hearing?: No Does the patient have difficulty seeing, even when wearing glasses/contacts?: No Does the patient have difficulty concentrating, remembering, or making decisions?: No Does the patient have difficulty walking or climbing stairs?: No Does the patient have difficulty dressing or bathing?: No Does the patient have difficulty doing errands alone such as visiting a doctor's office or shopping?: No  Assessment & Plan  1. Essential hypertension  - atenolol (TENORMIN) 50 MG tablet; Take 1 tablet (50 mg total) by mouth daily.  Dispense: 90 tablet; Refill: 1 - hydrochlorothiazide (HYDRODIURIL) 12.5 MG tablet; Take 1 tablet (12.5 mg total) by mouth daily.  Dispense: 90 tablet; Refill: 1 - COMPLETE METABOLIC PANEL WITH GFR  2. Gastro-esophageal reflux disease without esophagitis  - pantoprazole (PROTONIX) 20 MG tablet; Take 1 tablet (20 mg  total) by mouth daily.  Dispense: 90 tablet; Refill: 1  3. Pure hypercholesterolemia  - Lipid panel  4. Vitamin D deficiency  Continue otc vitamin D   5. Anxiety  - hydrOXYzine (ATARAX/VISTARIL) 10 MG tablet; Take 1 tablet (10 mg total) by mouth daily as needed.  Dispense: 30 tablet; Refill: 0  6. Diabetes mellitus screening  - Hemoglobin A1c  7. Seasonal and perennial allergic rhinitis  Continue Zyrtec, discussed nasal saline  8. Superficial burn of right forearm, initial encounter  - silver sulfADIAZINE (SILVADENE) 1 % cream; Apply 1 application topically daily.  Dispense: 50 g; Refill: 0

## 2017-06-24 ENCOUNTER — Telehealth: Payer: Self-pay | Admitting: Family Medicine

## 2017-06-24 LAB — COMPLETE METABOLIC PANEL WITH GFR
AG Ratio: 1.2 (calc) (ref 1.0–2.5)
ALT: 13 U/L (ref 6–29)
AST: 19 U/L (ref 10–35)
Albumin: 4.1 g/dL (ref 3.6–5.1)
Alkaline phosphatase (APISO): 88 U/L (ref 33–130)
BUN: 15 mg/dL (ref 7–25)
CALCIUM: 9.2 mg/dL (ref 8.6–10.4)
CHLORIDE: 101 mmol/L (ref 98–110)
CO2: 29 mmol/L (ref 20–32)
Creat: 0.85 mg/dL (ref 0.50–1.05)
GFR, EST AFRICAN AMERICAN: 91 mL/min/{1.73_m2} (ref >=60)
GFR, EST NON AFRICAN AMERICAN: 78 mL/min/{1.73_m2} (ref >=60)
GLUCOSE: 95 mg/dL (ref 65–99)
Globulin: 3.5 g/dL (calc) (ref 1.9–3.7)
Potassium: 3.8 mmol/L (ref 3.5–5.3)
Sodium: 137 mmol/L (ref 135–146)
TOTAL PROTEIN: 7.6 g/dL (ref 6.1–8.1)
Total Bilirubin: 0.5 mg/dL (ref 0.2–1.2)

## 2017-06-24 LAB — HEMOGLOBIN A1C
HEMOGLOBIN A1C: 5.4 %{Hb} (ref <5.7)
Mean Plasma Glucose: 108 (calc)
eAG (mmol/L): 6 (calc)

## 2017-06-24 LAB — LIPID PANEL
Cholesterol: 238 mg/dL — ABNORMAL HIGH (ref <200)
HDL: 65 mg/dL (ref >50)
LDL CHOLESTEROL (CALC): 139 mg/dL — AB
NON-HDL CHOLESTEROL (CALC): 173 mg/dL — AB (ref <130)
TRIGLYCERIDES: 206 mg/dL — AB (ref <150)
Total CHOL/HDL Ratio: 3.7 (calc) (ref <5.0)

## 2017-06-24 NOTE — Telephone Encounter (Signed)
-----   Message from Neoma LamingGwendolyn B Yates, RN sent at 06/24/2017  3:38 PM EDT ----- Pt returned call and lab results given to her with verbal understanding.

## 2017-07-24 ENCOUNTER — Other Ambulatory Visit: Payer: Self-pay

## 2017-07-24 DIAGNOSIS — F419 Anxiety disorder, unspecified: Secondary | ICD-10-CM

## 2017-07-24 MED ORDER — HYDROXYZINE HCL 10 MG PO TABS: 10.0000 mg | ORAL_TABLET | Freq: Every day | ORAL | 0 refills | Status: DC | PRN

## 2017-07-24 NOTE — Telephone Encounter (Signed)
Refill request for general medication: Hydroxyzine HCL 10 mg  Last office visit: 06/23/2017  Last physical exam: 10/29/2016  Follow-ups on file. 12/24/2017

## 2017-09-01 ENCOUNTER — Other Ambulatory Visit: Payer: Self-pay | Admitting: Family Medicine

## 2017-09-01 DIAGNOSIS — F419 Anxiety disorder, unspecified: Secondary | ICD-10-CM

## 2017-10-03 ENCOUNTER — Other Ambulatory Visit: Payer: Self-pay | Admitting: Family Medicine

## 2017-10-03 DIAGNOSIS — F419 Anxiety disorder, unspecified: Secondary | ICD-10-CM

## 2017-10-03 NOTE — Telephone Encounter (Signed)
Left a message for patient to call us back about medication and how it is working before refilling it per Dr. Carlynn PurlSowles.

## 2017-10-03 NOTE — Telephone Encounter (Signed)
Is Hydroxyzine working well for her?

## 2017-10-03 NOTE — Telephone Encounter (Signed)
Patient called, left VM to return call to the office for a question from Dr. Carlynn PurlSowles about Hydroxyzine.

## 2017-10-06 NOTE — Telephone Encounter (Signed)
Sent higher dose to see if it works better

## 2017-10-06 NOTE — Telephone Encounter (Signed)
Copied from CRM [phone]. Topic: Quick Communication - Office Called Patient >> Oct 03, 2017  2:08 PM Phineas SemenJohnson, Tiffany, CMA wrote: Reason for CRM: Is Hydroxyzine working well for her?

## 2017-10-06 NOTE — Telephone Encounter (Signed)
Copied from CRM #[id number]. Topic: Quick Communication - Office Called Patient >> Oct 03, 2017  2:08 PM Johnson, Tiffany, CMA wrote: Reason for CRM: Is Hydroxyzine working well for her?  

## 2017-10-06 NOTE — Telephone Encounter (Signed)
Patient called and asked about Hydralazine. She says the medication is not helping her. She takes it only as needed, but only helps a little while and not like she thinks it should. She mentioned taking Alprazolam at one time and says in comparison, Hydralazine is not effective.

## 2017-10-06 NOTE — Telephone Encounter (Signed)
FYI

## 2017-10-12 ENCOUNTER — Telehealth: Payer: Self-pay | Admitting: Family Medicine

## 2017-10-12 DIAGNOSIS — F419 Anxiety disorder, unspecified: Secondary | ICD-10-CM

## 2017-10-20 NOTE — Telephone Encounter (Signed)
Did you call it in?

## 2017-10-20 NOTE — Telephone Encounter (Signed)
Spoke with pharmacist at [facility] and they did receive the Hydroxyzine 25 mg.

## 2017-12-11 ENCOUNTER — Encounter: Payer: Self-pay | Admitting: Family Medicine

## 2017-12-11 ENCOUNTER — Ambulatory Visit (INDEPENDENT_AMBULATORY_CARE_PROVIDER_SITE_OTHER): Payer: BLUE CROSS/BLUE SHIELD | Admitting: Family Medicine

## 2017-12-11 VITALS — BP 132/84 | HR 98 | Temp 100.1°F | Resp 16 | Ht 62.0 in | Wt 189.4 lb

## 2017-12-11 DIAGNOSIS — R05 Cough: Secondary | ICD-10-CM | POA: Diagnosis not present

## 2017-12-11 DIAGNOSIS — J3089 Other allergic rhinitis: Secondary | ICD-10-CM

## 2017-12-11 DIAGNOSIS — J302 Other seasonal allergic rhinitis: Secondary | ICD-10-CM

## 2017-12-11 DIAGNOSIS — R059 Cough, unspecified: Secondary | ICD-10-CM

## 2017-12-11 DIAGNOSIS — J0110 Acute frontal sinusitis, unspecified: Secondary | ICD-10-CM

## 2017-12-11 MED ORDER — GUAIFENESIN ER 600 MG PO TB12: 600.0000 mg | ORAL_TABLET | Freq: Two times a day (BID) | ORAL | 0 refills | Status: DC | PRN

## 2017-12-11 MED ORDER — CETIRIZINE HCL 10 MG PO TABS: 10.0000 mg | ORAL_TABLET | Freq: Every day | ORAL | 1 refills | Status: DC

## 2017-12-11 MED ORDER — FLUTICASONE PROPIONATE 50 MCG/ACT NA SUSP
2.0000 | Freq: Every day | NASAL | 6 refills | Status: DC
Start: 2017-12-11 — End: 2018-05-05

## 2017-12-11 MED ORDER — AMOXICILLIN-POT CLAVULANATE 875-125 MG PO TABS: 1.0000 | ORAL_TABLET | Freq: Two times a day (BID) | ORAL | 0 refills | Status: AC

## 2017-12-11 MED ORDER — BENZONATATE 100 MG PO CAPS
100.0000 mg | ORAL_CAPSULE | Freq: Three times a day (TID) | ORAL | 0 refills | Status: DC | PRN
Start: 2017-12-11 — End: 2017-12-24

## 2017-12-11 NOTE — Patient Instructions (Addendum)
Tylenol 1000mg  every 8 hours for fever and pain.  Sinusitis, Adult Sinusitis is soreness and inflammation of your sinuses. Sinuses are hollow spaces in the bones around your face. They are located:  Around your eyes.  In the middle of your forehead.  Behind your nose.  In your cheekbones.  Your sinuses and nasal passages are lined with a stringy fluid (mucus). Mucus normally drains out of your sinuses. When your nasal tissues get inflamed or swollen, the mucus can get trapped or blocked so air cannot flow through your sinuses. This lets bacteria, viruses, and funguses grow, and that leads to infection. Follow these instructions at home: Medicines  Take, use, or apply over-the-counter and prescription medicines only as told by your doctor. These may include nasal sprays.  If you were prescribed an antibiotic medicine, take it as told by your doctor. Do not stop taking the antibiotic even if you start to feel better. Hydrate and Humidify  Drink enough water to keep your pee (urine) clear or pale yellow.  Use a cool mist humidifier to keep the humidity level in your home above 50%.  Breathe in steam for 10-15 minutes, 3-4 times a day or as told by your doctor. You can do this in the bathroom while a hot shower is running.  Try not to spend time in cool or dry air. Rest  Rest as much as possible.  Sleep with your head raised (elevated).  Make sure to get enough sleep each night. General instructions  Put a warm, moist washcloth on your face 3-4 times a day or as told by your doctor. This will help with discomfort.  Wash your hands often with soap and water. If there is no soap and water, use hand sanitizer.  Do not smoke. Avoid being around people who are smoking (secondhand smoke).  Keep all follow-up visits as told by your doctor. This is important. Contact a doctor if:  You have a fever.  Your symptoms get worse.  Your symptoms do not get better within 10 days. Get  help right away if:  You have a very bad headache.  You cannot stop throwing up (vomiting).  You have pain or swelling around your face or eyes.  You have trouble seeing.  You feel confused.  Your neck is stiff.  You have trouble breathing. This information is not intended to replace advice given to you by your health care provider. Make sure you discuss any questions you have with your health care provider. Document Released: 09/18/2007 Document Revised: 11/26/2015 Document Reviewed: 01/25/2015 Elsevier Interactive Patient Education  [employer]2018 [employer]

## 2017-12-11 NOTE — Progress Notes (Signed)
Name: Suzanne Griffin   MRN: [id number]    DOB: 15-Jan-1966   Date:12/11/2017       Progress Note  Subjective  Chief Complaint  Chief Complaint  Patient presents with  . URI    cough, congestion, scratchy throat, ears itch for 5 days    HPI  PT presents with 5 day history of URI symptoms - productive cough, sinus congestion, scratchy throat, itchy ears (this is improved now), some intermittent shortness of breath, ribcage pain with coughing.  Denies chest pain, abdominal pain, NVD. +fever today - having body aches and "nagging" headache.  Has taken Advil and coricidin which did help a little bit.  Patient Active Problem List   Diagnosis Date Noted  . Hyperlipidemia 08/17/2015  . Hypertension 07/11/2015  . Anxiety 07/11/2015  . Vitamin D deficiency 12/21/2014  . Gastro-esophageal reflux disease without esophagitis 09/18/2014    Social History   Tobacco Use  . Smoking status: Never Smoker  . Smokeless tobacco: Never Used  Substance Use Topics  . Alcohol use: No    Alcohol/week: 0.0 standard drinks     Current Outpatient Medications:  .  ALPRAZolam (XANAX) 0.25 MG tablet, TAKE 1 TABLET BY MOUTH 2 TIMES DAILY AS NEEDED FOR ANXIETY, Disp: 60 tablet, Rfl: 2 .  atenolol (TENORMIN) 50 MG tablet, Take 1 tablet (50 mg total) by mouth daily., Disp: 90 tablet, Rfl: 1 .  cetirizine (ZYRTEC) 10 MG tablet, Take 1 tablet (10 mg total) by mouth daily., Disp: 90 tablet, Rfl: 1 .  Cholecalciferol (VITAMIN D3 SUPER STRENGTH) 2000 UNITS TABS, Take 2 tablets by mouth daily., Disp: , Rfl:  .  hydrochlorothiazide (HYDRODIURIL) 12.5 MG tablet, Take 1 tablet (12.5 mg total) by mouth daily., Disp: 90 tablet, Rfl: 1 .  hydrOXYzine (ATARAX/VISTARIL) 25 MG tablet, TAKE 1 TABLET (25 MG TOTAL) BY MOUTH DAILY AS NEEDED., Disp: 90 tablet, Rfl: 0 .  vitamin B-12 (CYANOCOBALAMIN) 1000 MCG tablet, Take 1,000 mcg by mouth daily., Disp: , Rfl:  .  pantoprazole (PROTONIX) 20 MG tablet, Take 1 tablet (20 mg  total) by mouth daily., Disp: 90 tablet, Rfl: 1 .  silver sulfADIAZINE (SILVADENE) 1 % cream, Apply 1 application topically daily. (Patient not taking: Reported on 12/11/2017), Disp: 50 g, Rfl: 0  No Known Allergies  ROS  Ten systems reviewed and is negative except as mentioned in HPI.  Objective  Vitals:   12/11/17 0913  BP: 132/84  Pulse: (!) 120  Resp: 16  Temp: 100.1 F (37.8 C)  TempSrc: Oral  SpO2: 97%  Weight: 189 lb 6.4 oz (85.9 kg)  Height: 5\' 2"  (1.575 m)    Body mass index is 34.64 kg/m.  Nursing Note and Vital Signs reviewed.  Physical Exam  Constitutional: Patient appears well-developed and well-nourished. Obese No distress.  HEENT: head atraumatic, normocephalic, pupils equal and reactive to light, ears - TM's without erythema or effusion, neck with +submandibular adenopathy bilaterally, throat within normal limits; Bilateral frontal sinuses are tender to palpation - no swelling or erythema, bilateral maxillary sinuses are non-tender.  Cardiovascular: Normal rate, regular rhythm and normal heart sounds.  No murmur heard. No BLE edema. Pulmonary/Chest: Effort normal and breath sounds normal. No respiratory distress. Psychiatric: Patient has a normal mood and affect. behavior is normal. Judgment and thought content normal.  No results found for this or any previous visit (from the past 72 hour(s)).  Assessment & Plan  1. Acute non-recurrent frontal sinusitis - cetirizine (ZYRTEC) 10 MG tablet;  Take 1 tablet (10 mg total) by mouth daily.  Dispense: 90 tablet; Refill: 1 - amoxicillin-clavulanate (AUGMENTIN) 875-125 MG tablet; Take 1 tablet by mouth 2 (two) times daily for 10 days.  Dispense: 20 tablet; Refill: 0 - guaiFENesin (MUCINEX) 600 MG 12 hr tablet; Take 1 tablet (600 mg total) by mouth 2 (two) times daily as needed for cough or to loosen phlegm.  Dispense: 20 tablet; Refill: 0 - Flonase daily per orders.  2. Cough - benzonatate (TESSALON PERLES) 100 MG  capsule; Take 1 capsule (100 mg total) by mouth 3 (three) times daily as needed.  Dispense: 30 capsule; Refill: 0  3. Seasonal and perennial allergic rhinitis - cetirizine (ZYRTEC) 10 MG tablet; Take 1 tablet (10 mg total) by mouth daily.  Dispense: 90 tablet; Refill: 1  -Red flags and when to present for emergency care or RTC including fever >101.33F, chest pain, shortness of breath, new/worsening/un-resolving symptoms, reviewed with patient at time of visit. Follow up and care instructions discussed and provided in AVS.

## 2017-12-24 ENCOUNTER — Ambulatory Visit (INDEPENDENT_AMBULATORY_CARE_PROVIDER_SITE_OTHER): Payer: BLUE CROSS/BLUE SHIELD | Admitting: Family Medicine

## 2017-12-24 ENCOUNTER — Other Ambulatory Visit: Payer: Self-pay

## 2017-12-24 ENCOUNTER — Encounter: Payer: Self-pay | Admitting: Family Medicine

## 2017-12-24 VITALS — BP 132/86 | HR 77 | Temp 98.6°F | Resp 16 | Ht 62.0 in | Wt 188.8 lb

## 2017-12-24 DIAGNOSIS — I10 Essential (primary) hypertension: Secondary | ICD-10-CM | POA: Diagnosis not present

## 2017-12-24 DIAGNOSIS — K219 Gastro-esophageal reflux disease without esophagitis: Secondary | ICD-10-CM | POA: Diagnosis not present

## 2017-12-24 DIAGNOSIS — J3089 Other allergic rhinitis: Secondary | ICD-10-CM | POA: Diagnosis not present

## 2017-12-24 DIAGNOSIS — F330 Major depressive disorder, recurrent, mild: Secondary | ICD-10-CM

## 2017-12-24 DIAGNOSIS — E559 Vitamin D deficiency, unspecified: Secondary | ICD-10-CM

## 2017-12-24 DIAGNOSIS — Z23 Encounter for immunization: Secondary | ICD-10-CM

## 2017-12-24 DIAGNOSIS — E785 Hyperlipidemia, unspecified: Secondary | ICD-10-CM | POA: Diagnosis not present

## 2017-12-24 DIAGNOSIS — J302 Other seasonal allergic rhinitis: Secondary | ICD-10-CM

## 2017-12-24 DIAGNOSIS — F411 Generalized anxiety disorder: Secondary | ICD-10-CM

## 2017-12-24 LAB — COMPLETE METABOLIC PANEL WITH GFR
AG RATIO: 1.3 (calc) (ref 1.0–2.5)
ALKALINE PHOSPHATASE (APISO): 85 U/L (ref 33–130)
ALT: 13 U/L (ref 6–29)
AST: 16 U/L (ref 10–35)
Albumin: 3.9 g/dL (ref 3.6–5.1)
BUN: 11 mg/dL (ref 7–25)
CO2: 26 mmol/L (ref 20–32)
Calcium: 8.8 mg/dL (ref 8.6–10.4)
Chloride: 104 mmol/L (ref 98–110)
Creat: 0.76 mg/dL (ref 0.50–1.05)
GFR, Est African American: 104 mL/min/{1.73_m2} (ref >=60)
GFR, Est Non African American: 90 mL/min/{1.73_m2} (ref >=60)
Globulin: 3 g/dL (calc) (ref 1.9–3.7)
Glucose, Bld: 89 mg/dL (ref 65–99)
POTASSIUM: 4.1 mmol/L (ref 3.5–5.3)
SODIUM: 140 mmol/L (ref 135–146)
Total Bilirubin: 0.4 mg/dL (ref 0.2–1.2)
Total Protein: 6.9 g/dL (ref 6.1–8.1)

## 2017-12-24 LAB — LIPID PANEL
CHOL/HDL RATIO: 3.4 (calc) (ref <5.0)
Cholesterol: 203 mg/dL — ABNORMAL HIGH (ref <200)
HDL: 59 mg/dL (ref >50)
LDL CHOLESTEROL (CALC): 126 mg/dL — AB
Non-HDL Cholesterol (Calc): 144 mg/dL (calc) — ABNORMAL HIGH (ref <130)
TRIGLYCERIDES: 85 mg/dL (ref <150)

## 2017-12-24 MED ORDER — ATENOLOL 50 MG PO TABS: 50.0000 mg | ORAL_TABLET | Freq: Every day | ORAL | 1 refills | Status: DC

## 2017-12-24 MED ORDER — RANITIDINE HCL 150 MG PO TABS: 150.0000 mg | ORAL_TABLET | Freq: Every day | ORAL | 1 refills | Status: DC

## 2017-12-24 MED ORDER — HYDROCHLOROTHIAZIDE 12.5 MG PO TABS: 12.5000 mg | ORAL_TABLET | Freq: Every day | ORAL | 1 refills | Status: DC

## 2017-12-24 MED ORDER — ALPRAZOLAM 0.25 MG PO TABS: ORAL_TABLET | ORAL | 0 refills | Status: DC

## 2017-12-24 MED ORDER — BUSPIRONE HCL 5 MG PO TABS: 5.0000 mg | ORAL_TABLET | Freq: Two times a day (BID) | ORAL | 0 refills | Status: DC

## 2017-12-24 MED ORDER — PANTOPRAZOLE SODIUM 20 MG PO TBEC: 20.0000 mg | DELAYED_RELEASE_TABLET | Freq: Every day | ORAL | 1 refills | Status: DC

## 2017-12-24 NOTE — Telephone Encounter (Signed)
Refill request for general medication. Protonix  Last office visit: 12/24/2017
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
## 2017-12-29 ENCOUNTER — Encounter: Payer: Self-pay | Admitting: Family Medicine

## 2017-12-30 ENCOUNTER — Encounter: Payer: Self-pay | Admitting: Family Medicine

## 2018-01-21 ENCOUNTER — Other Ambulatory Visit: Payer: Self-pay | Admitting: Family Medicine

## 2018-01-21 DIAGNOSIS — F411 Generalized anxiety disorder: Secondary | ICD-10-CM

## 2018-01-21 NOTE — Telephone Encounter (Signed)
Refill request for general medication. Buspar to [facility]  Last office visit: 12/24/2017   Follow up on 03/26/2018

## 2018-02-22 ENCOUNTER — Other Ambulatory Visit: Payer: Self-pay | Admitting: Family Medicine

## 2018-02-22 DIAGNOSIS — F411 Generalized anxiety disorder: Secondary | ICD-10-CM

## 2018-03-26 ENCOUNTER — Ambulatory Visit (INDEPENDENT_AMBULATORY_CARE_PROVIDER_SITE_OTHER): Payer: BLUE CROSS/BLUE SHIELD | Admitting: Obstetrics and Gynecology

## 2018-03-26 ENCOUNTER — Ambulatory Visit: Payer: BLUE CROSS/BLUE SHIELD | Admitting: Family Medicine

## 2018-03-26 ENCOUNTER — Other Ambulatory Visit: Payer: Self-pay | Admitting: Obstetrics and Gynecology

## 2018-03-26 ENCOUNTER — Encounter: Payer: Self-pay | Admitting: Obstetrics and Gynecology

## 2018-03-26 VITALS — BP 145/83 | HR 70 | Ht 62.0 in | Wt 189.5 lb

## 2018-03-26 DIAGNOSIS — N950 Postmenopausal bleeding: Secondary | ICD-10-CM

## 2018-03-26 DIAGNOSIS — E559 Vitamin D deficiency, unspecified: Secondary | ICD-10-CM | POA: Diagnosis not present

## 2018-03-26 DIAGNOSIS — Z01419 Encounter for gynecological examination (general) (routine) without abnormal findings: Secondary | ICD-10-CM

## 2018-03-26 NOTE — Progress Notes (Signed)
Subjective:   Suzanne Griffin is a 52 y.o. No obstetric history on file. Hispanic female here for a routine well-woman exam.  No LMP recorded. Patient is postmenopausal.    Current complaints: light menses noted with PMS s/s this past weekend, lasted for 5 days and was very light, denies any other changes. Last menses Jan 2018 so thought she was menopausal. PCP: [facility]       doesn't desire labs  Social History: Sexual: heterosexual Marital Status: married Living situation: with spouse Occupation: Armed forces technical officer at [employer] Tobacco/alcohol: no tobacco use Illicit drugs: no history of illicit drug use  The following portions of the patient's history were reviewed and updated as appropriate: allergies, current medications, past family history, past medical history, past social history, past surgical history and problem list.  Past Medical History Past Medical History:  Diagnosis Date  . Anxiety   . GERD (gastroesophageal reflux disease)   . Hyperlipidemia   . Hypertension   . Hypothyroidism     Past Surgical History Past Surgical History:  Procedure Laterality Date  . BLADDER SURGERY    . CHOLECYSTECTOMY      Gynecologic History No obstetric history on file.  No LMP recorded. Patient is postmenopausal. Contraception: post menopausal status Last Pap: 12/2016. Results were: normal Last mammogram: 12/2016. Results were: normal  Obstetric History OB History  No obstetric history on file.    Current Medications Current Outpatient Medications on File Prior to Visit  Medication Sig Dispense Refill  . ALPRAZolam (XANAX) 0.25 MG tablet TAKE 1 TABLET BY MOUTH 2 TIMES DAILY AS NEEDED FOR ANXIETY 5 tablet 0  . atenolol (TENORMIN) 50 MG tablet Take 1 tablet (50 mg total) by mouth daily. 90 tablet 1  . busPIRone (BUSPAR) 5 MG tablet TAKE 1 TABLET BY MOUTH TWICE A DAY 60 tablet 0  . Cholecalciferol (VITAMIN D3 SUPER STRENGTH) 2000 UNITS TABS Take 2 tablets by mouth daily.    .  hydrochlorothiazide (HYDRODIURIL) 12.5 MG tablet Take 1 tablet (12.5 mg total) by mouth daily. 90 tablet 1  . ranitidine (ZANTAC) 150 MG tablet Take 1 tablet (150 mg total) by mouth at bedtime. 90 tablet 1  . vitamin B-12 (CYANOCOBALAMIN) 1000 MCG tablet Take 1,000 mcg by mouth daily.    . cetirizine (ZYRTEC) 10 MG tablet Take 1 tablet (10 mg total) by mouth daily. (Patient not taking: Reported on 12/24/2017) 90 tablet 1  . fluticasone (FLONASE) 50 MCG/ACT nasal spray Place 2 sprays into both nostrils daily. (Patient not taking: Reported on 12/24/2017) 16 g 6  . pantoprazole (PROTONIX) 20 MG tablet Take 1 tablet (20 mg total) by mouth daily. 90 tablet 1   No current facility-administered medications on file prior to visit.     Review of Systems Patient denies any headaches, blurred vision, shortness of breath, chest pain, abdominal pain, problems with bowel movements, urination, or intercourse.  Objective:  BP (!) 145/83   Pulse 70   Ht 5\' 2"  (1.575 m)   Wt 189 lb 8 oz (86 kg)   BMI 34.66 kg/m  Physical Exam  General:  Well developed, well nourished, no acute distress. She is alert and oriented x3. Skin:  Warm and dry Neck:  Midline trachea, no thyromegaly or nodules Cardiovascular: Regular rate and rhythm, no murmur heard Lungs:  Effort normal, all lung fields clear to auscultation bilaterally Breasts:  No dominant palpable mass, retraction, or nipple discharge Abdomen:  Soft, non tender, no hepatosplenomegaly or masses Pelvic:  External genitalia  is normal in appearance.  The vagina is normal in appearance. The cervix is bulbous, no CMT.  Thin prep pap is not done. Uterus is felt to be normal size, shape, and contour.  No adnexal masses or tenderness noted.* Extremities:  No swelling or varicosities noted Psych:  She has a normal mood and affect  Assessment:   Healthy well-woman exam PMB Vitamin D deficiency obesity  Plan:  Flu vaccine already given.  Labs obtained and will  follow up accordingly Counseled on common causes of postmenopausal bleeding and she desires watching, will return for pelvic u/s if occurs again and if labs are normal.   F/U 1 year for AE, or sooner if needed Mammogram ordered   Suzan NailerN , CNM

## 2018-03-27 LAB — THYROID PANEL WITH TSH
FREE THYROXINE INDEX: 1.8 (ref 1.2–4.9)
T3 UPTAKE RATIO: 24 % (ref 24–39)
T4, Total: 7.7 ug/dL (ref 4.5–12.0)
TSH: 1.49 u[IU]/mL (ref 0.450–4.500)

## 2018-03-27 LAB — FSH/LH
FSH: 70.5 m[IU]/mL
LH: 37.5 m[IU]/mL

## 2018-03-27 LAB — PROGESTERONE: Progesterone: <0.1 ng/mL

## 2018-03-27 LAB — VITAMIN D 25 HYDROXY (VIT D DEFICIENCY, FRACTURES): Vit D, 25-Hydroxy: 22.3 ng/mL — ABNORMAL LOW (ref 30.0–100.0)

## 2018-03-27 LAB — ESTRADIOL: ESTRADIOL: 25 pg/mL

## 2018-04-02 ENCOUNTER — Ambulatory Visit
Admission: RE | Admit: 2018-04-02 | Discharge: 2018-04-02 | Disposition: A | Discharge status: Home / Self Care | Payer: BLUE CROSS/BLUE SHIELD | Source: Ambulatory Visit | Attending: Obstetrics and Gynecology | Admitting: Obstetrics and Gynecology

## 2018-04-02 DIAGNOSIS — Z1231 Encounter for screening mammogram for malignant neoplasm of breast: Secondary | ICD-10-CM | POA: Diagnosis not present

## 2018-04-02 DIAGNOSIS — Z01419 Encounter for gynecological examination (general) (routine) without abnormal findings: Secondary | ICD-10-CM | POA: Diagnosis not present

## 2018-04-23 ENCOUNTER — Ambulatory Visit: Payer: BLUE CROSS/BLUE SHIELD | Admitting: Family Medicine

## 2018-05-05 ENCOUNTER — Encounter: Payer: Self-pay | Admitting: Nurse Practitioner

## 2018-05-05 ENCOUNTER — Other Ambulatory Visit: Payer: Self-pay

## 2018-05-05 ENCOUNTER — Ambulatory Visit (INDEPENDENT_AMBULATORY_CARE_PROVIDER_SITE_OTHER): Payer: BLUE CROSS/BLUE SHIELD | Admitting: Nurse Practitioner

## 2018-05-05 VITALS — BP 130/76 | HR 85 | Temp 98.6°F | Resp 16 | Ht 62.0 in | Wt 192.7 lb

## 2018-05-05 DIAGNOSIS — I10 Essential (primary) hypertension: Secondary | ICD-10-CM | POA: Diagnosis not present

## 2018-05-05 DIAGNOSIS — F419 Anxiety disorder, unspecified: Secondary | ICD-10-CM | POA: Diagnosis not present

## 2018-05-05 DIAGNOSIS — E559 Vitamin D deficiency, unspecified: Secondary | ICD-10-CM

## 2018-05-05 DIAGNOSIS — E7800 Pure hypercholesterolemia, unspecified: Secondary | ICD-10-CM

## 2018-05-05 DIAGNOSIS — K219 Gastro-esophageal reflux disease without esophagitis: Secondary | ICD-10-CM | POA: Diagnosis not present

## 2018-05-05 DIAGNOSIS — F3341 Major depressive disorder, recurrent, in partial remission: Secondary | ICD-10-CM

## 2018-05-05 DIAGNOSIS — R51 Headache: Secondary | ICD-10-CM

## 2018-05-05 DIAGNOSIS — R519 Headache, unspecified: Secondary | ICD-10-CM

## 2018-05-05 DIAGNOSIS — F411 Generalized anxiety disorder: Secondary | ICD-10-CM

## 2018-05-05 MED ORDER — ATENOLOL 50 MG PO TABS: 50.0000 mg | ORAL_TABLET | Freq: Every day | ORAL | 1 refills | Status: DC

## 2018-05-05 MED ORDER — PANTOPRAZOLE SODIUM 20 MG PO TBEC: 20.0000 mg | DELAYED_RELEASE_TABLET | Freq: Every day | ORAL | 1 refills | Status: DC

## 2018-05-05 MED ORDER — ALPRAZOLAM 0.25 MG PO TABS: ORAL_TABLET | ORAL | 0 refills | Status: DC

## 2018-05-05 MED ORDER — HYDROCHLOROTHIAZIDE 12.5 MG PO TABS: 12.5000 mg | ORAL_TABLET | Freq: Every day | ORAL | 1 refills | Status: DC

## 2018-05-05 NOTE — Progress Notes (Signed)
Name: Suzanne Griffin MRN: [id number] DOB: 1966-04-02 Date:05/05/2018 Progress Note Subjective Chief Complaint Chief Complaint  Patient presents with  . Follow-up    3 mth f/u  . Medication Refill    xanax, protonix, atenolol   HPI  HTN: she has been taking HCTZ 12.5 mg and Atenolol 50 mg. Patient takes daily with no missed doses. No complaints of headache, chest pain, or dizziness.   Hyperlipidemia: Lipid panel has improved since previous visit. Not on any medications presently. Patient has diet modification, limiting fat intake. In the morning, she consumes sugar-free cookie with milk. Lunch consumes fruits and [place] sandwich. Eats banana for mid-afternoon break. Dinner consumes stew. Tries to limit rice intake. Increase consumption of fruits and vegetables.  Lab Results  Component Value Date   CHOL 203 (H) 12/24/2017   HDL 59 12/24/2017   LDLCALC 126 (H) 12/24/2017   TRIG 85 12/24/2017   CHOLHDL 3.4 12/24/2017    The 40-JWJX10-year ASCVD risk score Denman George(Goff DC Jr., et al., 2013) is: 2.4%   Values used to calculate the score:     Age: [age] years     Sex: Female     Is Non-Hispanic African American: No     Diabetic: No     Tobacco smoker: No     Systolic Blood Pressure: 130 mmHg     Is BP treated: Yes     HDL Cholesterol: 59 mg/dL     Total Cholesterol: 203 mg/dL  GERD: She is taking low dose Pantoprazole 20 mg, taken as prescribed. Takes Ranitidine and mylanta prn. Concerned about the recall. Suggest change to famotidine if warranted. Acid reflux is triggered by stress, fatty, or greasy foods. With acid reflux complains of diarrhea or constipation. Complains of gas pain, related to tomato consumption.  Anxiety: She states takes alprazolam prn and has enough at home, she tried hydroxyzine but states takes too long to work. Takes approximately 2-3 tablets per week max. Last dose was a couple of weeks ago. Buspar makes her "feel jumpy". No longer taking. Complains of anxiety, triggered  from stress. Normal daily stress.   Obesity: See above in hyperlipidemia for diet discussed.   Wt Readings from Last 3 Encounters:  05/05/18 192 lb 11.2 oz (87.4 kg)  03/26/18 189 lb 8 oz (86 kg)  12/24/17 188 lb 12.8 oz (85.6 kg)     Major Depression: Patient is feeling much better than last visit states she was just stressed about parents health but has come to terms with that. PHQ9 2, improved from 9.  Depression screen [facility] 05/05/2018 12/24/2017 12/11/2017  Decreased Interest 0 1 1  Down, Depressed, Hopeless 1 1 0  PHQ - 2 Score 1 2 1   Altered sleeping - 2 1  Tired, decreased energy - 1 0  Change in appetite - 2 1  Feeling bad or failure about yourself  - 1 1  Trouble concentrating - 1 0  Moving slowly or fidgety/restless - 0 0  Suicidal thoughts - 0 0  PHQ-9 Score - 9 4  Difficult doing work/chores - Somewhat difficult Not difficult at all    Vitamin D deficiency: She started taking otc supplementation 2000 units daily.  Recently had sinus infection with headache, and pain above left eye approximately a week ago. Resolved with OTC sinus medications.  Father has a hx of hypertension. No family of heart disease or stroke.   Patient Active Problem List   Diagnosis Date Noted  . Morbid obesity (HCC) 12/24/2017  .  Hyperlipidemia 08/17/2015  . Hypertension 07/11/2015  . Anxiety 07/11/2015  . Vitamin D deficiency 12/21/2014  . Gastro-esophageal reflux disease without esophagitis 09/18/2014   Past Medical History:  Diagnosis Date  . Anxiety   . GERD (gastroesophageal reflux disease)   . Hyperlipidemia   . Hypertension   . Hypothyroidism    Past Surgical History:  Procedure Laterality Date  . BLADDER SURGERY    . CHOLECYSTECTOMY     Social History   Tobacco Use  . Smoking status: Never Smoker  . Smokeless tobacco: Never Used  Substance Use Topics  . Alcohol use: No    Alcohol/week: 0.0 standard drinks    Current Outpatient Medications:  .  ALPRAZolam  (XANAX) 0.25 MG tablet, TAKE 1 TABLET BY MOUTH 2 TIMES DAILY AS NEEDED FOR ANXIETY, Disp: 5 tablet, Rfl: 0 .  atenolol (TENORMIN) 50 MG tablet, Take 1 tablet (50 mg total) by mouth daily., Disp: 90 tablet, Rfl: 1 .  cetirizine (ZYRTEC) 10 MG tablet, Take 1 tablet (10 mg total) by mouth daily., Disp: 90 tablet, Rfl: 1 .  Cholecalciferol (VITAMIN D3 SUPER STRENGTH) 2000 UNITS TABS, Take 2 tablets by mouth daily., Disp: , Rfl:  .  hydrochlorothiazide (HYDRODIURIL) 12.5 MG tablet, Take 1 tablet (12.5 mg total) by mouth daily., Disp: 90 tablet, Rfl: 1 .  ranitidine (ZANTAC) 150 MG tablet, Take 1 tablet (150 mg total) by mouth at bedtime., Disp: 90 tablet, Rfl: 1 .  vitamin B-12 (CYANOCOBALAMIN) 1000 MCG tablet, Take 1,000 mcg by mouth daily., Disp: , Rfl:  .  pantoprazole (PROTONIX) 20 MG tablet, Take 1 tablet (20 mg total) by mouth daily., Disp: 90 tablet, Rfl: 1 No Known Allergies ROS  No other specific complaints in a complete review of systems (except as listed in HPI above). Objective Vitals:   05/05/18 1416  BP: 130/76  Pulse: 85  Resp: 16  Temp: 98.6 F (37 C)  TempSrc: Oral  SpO2: 95%  Weight: 192 lb 11.2 oz (87.4 kg)  Height: 5\' 2"  (1.575 m)    Body mass index is 35.25 kg/m. Nursing Note and Vital Signs reviewed.  Physical Exam Constitutional:      Appearance: Normal appearance.  HENT:     Head: Normocephalic.  Cardiovascular:     Rate and Rhythm: Normal rate and regular rhythm.     Heart sounds: Normal heart sounds.  Pulmonary:     Effort: Pulmonary effort is normal.     Breath sounds: Normal breath sounds. No wheezing, rhonchi or rales.  Chest:     Chest wall: No tenderness.  Abdominal:     General: Bowel sounds are normal.     Palpations: Abdomen is soft.     Tenderness: There is no abdominal tenderness.  Skin:    General: Skin is warm and dry.  Neurological:     General: No focal deficit present.     Mental Status: She is alert.  Psychiatric:        Mood  and Affect: Mood normal.        Thought Content: Thought content normal.     No results found for this or any previous visit (from the past 48 hour(s)). Assessment & Plan  1. Essential hypertension Well controlled today. Continue current medication regimen/recommendations. - hydrochlorothiazide (HYDRODIURIL) 12.5 MG tablet; Take 1 tablet (12.5 mg total) by mouth daily.  Dispense: 90 tablet; Refill: 1 - atenolol (TENORMIN) 50 MG tablet; Take 1 tablet (50 mg total) by mouth daily.  Dispense: 90  tablet; Refill: 1  2. Gastro-esophageal reflux disease without esophagitis Additionally takes ranitidine and mylanta prn. Discussed diet control and triggers for acid reflux.  - pantoprazole (PROTONIX) 20 MG tablet; Take 1 tablet (20 mg total) by mouth daily.  Dispense: 90 tablet; Refill: 1  3. Vitamin D deficiency Continue taking Vitamin D 2000 units daily.   4. Anxiety Refill on Alprazolam medication per Dr.Sowles- to take sparingly. Controlled substance contract signed.  5. Pure hypercholesterolemia Discussed diet modification. Repeat lipid panel during 6 month follow-up.  6. Morbid obesity (HCC) BMI >35 + hypertension and GERD; Discussed diet and lifestyle modifications. Continue to monitor.  7. Recurrent major depressive disorder, in partial remission (HCC) Well controlled and in remission. Patient reports feeling overall better. Continue to monitor.   8. Sinus headache Now resolved.   Follow up with Dr.Sowles in 6 months.  ?

## 2018-05-05 NOTE — Telephone Encounter (Signed)
Refill request was sent to Dr. Krichna Sowles for approval and submission.  

## 2018-05-05 NOTE — Progress Notes (Deleted)
klk

## 2018-05-05 NOTE — Patient Instructions (Addendum)
-   If you want to switch off of your ranitidine, you can switch to famotidine 20mg  at night time; but your medicine was recalled your pharmacy would have told you.   Bad cholesterol, also called low-density lipoprotein (LDL), carries cholesterol and other fats that your liver makes to your body tissue. If it builds up in blood vessels, LDL can cause heart disease and other health problems. Your LDL level should be below 149. If you have diabetes or a possible heart problem, your LDL should be below 70.  Eat: Eat 20 to 30 grams of soluble fiber every day. Foods such as fruits and vegetables, whole grains, beans, peas, nuts, and seeds can help lower LDL. Avoid: Saturated fats (Dairy foods - such as butter, cream, ghee, regular-fat milk and cheese. Meat - such as fatty cuts of beef, pork and lamb, processed meats like salami, sausages and the skin on chicken. Lard., fatty snack foods, cakes, biscuits, pies and deep fried foods) Avoid smoking

## 2018-05-05 NOTE — Progress Notes (Deleted)
Name: Suzanne Griffin   MRN: [id number]    DOB: 1966-02-25   Date:05/05/2018       Progress Note  Subjective  Chief Complaint  No chief complaint on file.   HPI  ***  Patient Active Problem List   Diagnosis Date Noted  . Morbid obesity (HCC) 12/24/2017  . Hyperlipidemia 08/17/2015  . Hypertension 07/11/2015  . Anxiety 07/11/2015  . Vitamin D deficiency 12/21/2014  . Gastro-esophageal reflux disease without esophagitis 09/18/2014    Past Medical History:  Diagnosis Date  . Anxiety   . GERD (gastroesophageal reflux disease)   . Hyperlipidemia   . Hypertension   . Hypothyroidism     Past Surgical History:  Procedure Laterality Date  . BLADDER SURGERY    . CHOLECYSTECTOMY      Social History   Tobacco Use  . Smoking status: Never Smoker  . Smokeless tobacco: Never Used  Substance Use Topics  . Alcohol use: No    Alcohol/week: 0.0 standard drinks     Current Outpatient Medications:  .  ALPRAZolam (XANAX) 0.25 MG tablet, TAKE 1 TABLET BY MOUTH 2 TIMES DAILY AS NEEDED FOR ANXIETY, Disp: 5 tablet, Rfl: 0 .  atenolol (TENORMIN) 50 MG tablet, Take 1 tablet (50 mg total) by mouth daily., Disp: 90 tablet, Rfl: 1 .  busPIRone (BUSPAR) 5 MG tablet, TAKE 1 TABLET BY MOUTH TWICE A DAY, Disp: 60 tablet, Rfl: 0 .  cetirizine (ZYRTEC) 10 MG tablet, Take 1 tablet (10 mg total) by mouth daily. (Patient not taking: Reported on 12/24/2017), Disp: 90 tablet, Rfl: 1 .  Cholecalciferol (VITAMIN D3 SUPER STRENGTH) 2000 UNITS TABS, Take 2 tablets by mouth daily., Disp: , Rfl:  .  fluticasone (FLONASE) 50 MCG/ACT nasal spray, Place 2 sprays into both nostrils daily. (Patient not taking: Reported on 12/24/2017), Disp: 16 g, Rfl: 6 .  hydrochlorothiazide (HYDRODIURIL) 12.5 MG tablet, Take 1 tablet (12.5 mg total) by mouth daily., Disp: 90 tablet, Rfl: 1 .  pantoprazole (PROTONIX) 20 MG tablet, Take 1 tablet (20 mg total) by mouth daily., Disp: 90 tablet, Rfl: 1 .  ranitidine (ZANTAC) 150 MG  tablet, Take 1 tablet (150 mg total) by mouth at bedtime., Disp: 90 tablet, Rfl: 1 .  vitamin B-12 (CYANOCOBALAMIN) 1000 MCG tablet, Take 1,000 mcg by mouth daily., Disp: , Rfl:   No Known Allergies  ROS  ***  No other specific complaints in a complete review of systems (except as listed in HPI above).  Objective  There were no vitals filed for this visit. ***  There is no height or weight on file to calculate BMI.  Nursing Note and Vital Signs reviewed.  Physical Exam  ***   No results found for this or any previous visit (from the past 48 hour(s)).  Assessment & Plan  There are no diagnoses linked to this encounter.   -Red flags and when to present for emergency care or RTC including fever >101.83F, chest pain, shortness of breath, new/worsening/un-resolving symptoms, *** reviewed with patient at time of visit. Follow up and care instructions discussed and provided in AVS. -Reviewed Health Maintenance: ***

## 2018-07-01 ENCOUNTER — Other Ambulatory Visit: Payer: Self-pay | Admitting: Family Medicine

## 2018-07-01 DIAGNOSIS — J3089 Other allergic rhinitis: Secondary | ICD-10-CM

## 2018-07-01 DIAGNOSIS — J302 Other seasonal allergic rhinitis: Secondary | ICD-10-CM

## 2018-07-01 DIAGNOSIS — J0110 Acute frontal sinusitis, unspecified: Secondary | ICD-10-CM

## 2018-07-02 ENCOUNTER — Other Ambulatory Visit: Payer: Self-pay | Admitting: Family Medicine

## 2018-07-02 DIAGNOSIS — K219 Gastro-esophageal reflux disease without esophagitis: Secondary | ICD-10-CM

## 2018-07-02 NOTE — Telephone Encounter (Signed)
[facility] request for general [facility]. [facility]  Last office visit 05/05/2018  Follow up on 11/03/2018

## 2018-07-08 ENCOUNTER — Other Ambulatory Visit: Payer: Self-pay

## 2018-07-08 MED ORDER — CETIRIZINE HCL 10 MG PO TABS: 10.0000 mg | ORAL_TABLET | Freq: Every day | ORAL | 11 refills | Status: DC

## 2018-07-08 NOTE — Telephone Encounter (Signed)
[facility]ill request for general medication. [facility]   Last office visit 05/05/2018   Follow up on  11/03/2018

## 2018-08-03 ENCOUNTER — Other Ambulatory Visit: Payer: Self-pay | Admitting: Family Medicine

## 2018-08-03 DIAGNOSIS — F411 Generalized anxiety disorder: Secondary | ICD-10-CM

## 2018-08-03 NOTE — Telephone Encounter (Signed)
LVM for pt to call the office to schedule an appt for med refill °

## 2018-09-04 ENCOUNTER — Other Ambulatory Visit: Payer: Self-pay | Admitting: Nurse Practitioner

## 2018-09-04 DIAGNOSIS — I10 Essential (primary) hypertension: Secondary | ICD-10-CM

## 2018-11-03 ENCOUNTER — Ambulatory Visit: Payer: PRIVATE HEALTH INSURANCE | Admitting: Family Medicine

## 2018-11-13 ENCOUNTER — Encounter: Payer: Self-pay | Admitting: Family Medicine

## 2018-11-13 ENCOUNTER — Ambulatory Visit (INDEPENDENT_AMBULATORY_CARE_PROVIDER_SITE_OTHER): Payer: BC Managed Care – PPO | Admitting: Family Medicine

## 2018-11-13 ENCOUNTER — Other Ambulatory Visit: Payer: Self-pay

## 2018-11-13 DIAGNOSIS — E7800 Pure hypercholesterolemia, unspecified: Secondary | ICD-10-CM

## 2018-11-13 DIAGNOSIS — I10 Essential (primary) hypertension: Secondary | ICD-10-CM

## 2018-11-13 DIAGNOSIS — K219 Gastro-esophageal reflux disease without esophagitis: Secondary | ICD-10-CM | POA: Diagnosis not present

## 2018-11-13 DIAGNOSIS — F419 Anxiety disorder, unspecified: Secondary | ICD-10-CM

## 2018-11-13 MED ORDER — HYDROXYZINE HCL 10 MG PO TABS: 10.0000 mg | ORAL_TABLET | Freq: Three times a day (TID) | ORAL | 0 refills | Status: DC | PRN

## 2018-11-13 MED ORDER — ALPRAZOLAM 0.25 MG PO TABS: ORAL_TABLET | ORAL | 0 refills | Status: DC
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
## 2019-01-05 ENCOUNTER — Other Ambulatory Visit: Payer: Self-pay

## 2019-01-05 DIAGNOSIS — I10 Essential (primary) hypertension: Secondary | ICD-10-CM

## 2019-01-05 DIAGNOSIS — K219 Gastro-esophageal reflux disease without esophagitis: Secondary | ICD-10-CM

## 2019-01-05 MED ORDER — HYDROCHLOROTHIAZIDE 12.5 MG PO TABS: 12.5000 mg | ORAL_TABLET | Freq: Every day | ORAL | 0 refills | Status: DC

## 2019-01-05 MED ORDER — PANTOPRAZOLE SODIUM 20 MG PO TBEC: 20.0000 mg | DELAYED_RELEASE_TABLET | Freq: Every day | ORAL | 0 refills | Status: DC

## 2019-01-15 ENCOUNTER — Encounter: Payer: Self-pay | Admitting: Family Medicine

## 2019-01-15 ENCOUNTER — Other Ambulatory Visit: Payer: Self-pay | Admitting: Family Medicine

## 2019-01-15 DIAGNOSIS — K219 Gastro-esophageal reflux disease without esophagitis: Secondary | ICD-10-CM

## 2019-01-15 MED ORDER — PANTOPRAZOLE SODIUM 20 MG PO TBEC: 20.0000 mg | DELAYED_RELEASE_TABLET | Freq: Every day | ORAL | 1 refills | Status: DC

## 2019-03-08 ENCOUNTER — Other Ambulatory Visit: Payer: Self-pay | Admitting: Family Medicine

## 2019-03-08 DIAGNOSIS — I10 Essential (primary) hypertension: Secondary | ICD-10-CM

## 2019-03-08 DIAGNOSIS — F419 Anxiety disorder, unspecified: Secondary | ICD-10-CM

## 2019-03-08 NOTE — Telephone Encounter (Signed)
Pt states she has not moved to [place].  Pt states she saw Raquel Sarna for a virtual visit on 11/13/2018 for a med follow up.  Pt staes Raquel Sarna advised to return in 6 months. Pt is out of her  atenolol (TENORMIN) 50 MG tablet  90 day refill And needs asap  [id number] [address], [place] [phone] (Phone) [phone] (Fax)

## 2019-03-08 NOTE — Telephone Encounter (Signed)
Medication Refill - Medication:  atenolol (TENORMIN) 50 MG tablet   Has the patient contacted their pharmacy?  Yes advised to call. Pharmacy has sent requests. Pt is completely out.   Preferred Pharmacy (with phone number or street name):  [id number] [address], [place] [phone] (Phone) [phone] (Fax)   Agent: Please be advised that RX refills may take up to 3 business days. We ask that you follow-up with your pharmacy.

## 2019-03-08 NOTE — Telephone Encounter (Signed)
Per Sowles pt moved to new [employer]

## 2019-03-09 NOTE — Telephone Encounter (Signed)
CAN NOT CLOSE

## 2019-03-10 MED ORDER — ATENOLOL 50 MG PO TABS: 50.0000 mg | ORAL_TABLET | Freq: Every day | ORAL | 1 refills | Status: DC

## 2019-03-10 MED ORDER — ALPRAZOLAM 0.25 MG PO TABS: ORAL_TABLET | ORAL | 0 refills | Status: DC

## 2019-03-10 MED ORDER — HYDROXYZINE HCL 10 MG PO TABS: 10.0000 mg | ORAL_TABLET | Freq: Three times a day (TID) | ORAL | 1 refills | Status: DC | PRN

## 2019-03-10 NOTE — Telephone Encounter (Signed)
Patient states she DID NOT MOVE TO [place]. Please schedule her a follow up. She needs her refills she only got a 30 day supply.

## 2019-03-10 NOTE — Telephone Encounter (Signed)
Patient states she didn't tell anyone she was seeking another provider, patient states she was last seen 11/13/2018. Patient would like a follow up call today regarding medication request mentioned below best # [phone] or [phone]   [id number] [address], [place]

## 2019-03-15 NOTE — Telephone Encounter (Signed)
LVM to schedule appt for follow up.

## 2019-03-30 ENCOUNTER — Other Ambulatory Visit: Payer: Self-pay | Admitting: Family Medicine

## 2019-03-30 DIAGNOSIS — I10 Essential (primary) hypertension: Secondary | ICD-10-CM

## 2019-03-31 ENCOUNTER — Other Ambulatory Visit: Payer: Self-pay

## 2019-03-31 ENCOUNTER — Ambulatory Visit (INDEPENDENT_AMBULATORY_CARE_PROVIDER_SITE_OTHER): Payer: BC Managed Care – PPO | Admitting: Family Medicine

## 2019-03-31 ENCOUNTER — Encounter: Payer: Self-pay | Admitting: Family Medicine

## 2019-03-31 ENCOUNTER — Encounter: Payer: BLUE CROSS/BLUE SHIELD | Admitting: Obstetrics and Gynecology

## 2019-03-31 VITALS — BP 130/71 | Temp 97.7°F

## 2019-03-31 DIAGNOSIS — F419 Anxiety disorder, unspecified: Secondary | ICD-10-CM | POA: Diagnosis not present

## 2019-03-31 DIAGNOSIS — I10 Essential (primary) hypertension: Secondary | ICD-10-CM | POA: Diagnosis not present

## 2019-03-31 DIAGNOSIS — G4700 Insomnia, unspecified: Secondary | ICD-10-CM

## 2019-03-31 DIAGNOSIS — K219 Gastro-esophageal reflux disease without esophagitis: Secondary | ICD-10-CM

## 2019-03-31 DIAGNOSIS — E7800 Pure hypercholesterolemia, unspecified: Secondary | ICD-10-CM

## 2019-03-31 MED ORDER — ALPRAZOLAM 0.25 MG PO TABS: 0.2500 mg | ORAL_TABLET | Freq: Every day | ORAL | 0 refills | Status: DC | PRN

## 2019-03-31 MED ORDER — ATENOLOL 50 MG PO TABS: 50.0000 mg | ORAL_TABLET | Freq: Every day | ORAL | 1 refills | Status: DC

## 2019-03-31 MED ORDER — HYDROCHLOROTHIAZIDE 12.5 MG PO TABS: 12.5000 mg | ORAL_TABLET | Freq: Every day | ORAL | 1 refills | Status: DC

## 2019-03-31 MED ORDER — HYDROXYZINE HCL 10 MG PO TABS: 10.0000 mg | ORAL_TABLET | Freq: Three times a day (TID) | ORAL | 1 refills | Status: DC | PRN

## 2019-03-31 NOTE — Progress Notes (Signed)
Worries about her parents that live in [place]
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
## 2019-04-23 ENCOUNTER — Other Ambulatory Visit: Payer: Self-pay | Admitting: Family Medicine

## 2019-04-23 DIAGNOSIS — F419 Anxiety disorder, unspecified: Secondary | ICD-10-CM

## 2019-04-23 DIAGNOSIS — G4700 Insomnia, unspecified: Secondary | ICD-10-CM

## 2019-04-23 MED ORDER — HYDROXYZINE HCL 10 MG PO TABS: 10.0000 mg | ORAL_TABLET | Freq: Three times a day (TID) | ORAL | 1 refills | Status: DC | PRN

## 2019-05-24 ENCOUNTER — Other Ambulatory Visit: Payer: Self-pay | Admitting: Family Medicine

## 2019-05-24 DIAGNOSIS — G4700 Insomnia, unspecified: Secondary | ICD-10-CM

## 2019-05-24 DIAGNOSIS — F419 Anxiety disorder, unspecified: Secondary | ICD-10-CM

## 2019-07-07 ENCOUNTER — Other Ambulatory Visit: Payer: Self-pay

## 2019-07-07 ENCOUNTER — Ambulatory Visit (INDEPENDENT_AMBULATORY_CARE_PROVIDER_SITE_OTHER): Payer: BC Managed Care – PPO | Admitting: Family Medicine

## 2019-07-07 ENCOUNTER — Encounter: Payer: Self-pay | Admitting: Family Medicine

## 2019-07-07 VITALS — BP 124/82 | HR 85 | Temp 97.1°F | Resp 14 | Ht 62.0 in | Wt 191.8 lb

## 2019-07-07 DIAGNOSIS — I10 Essential (primary) hypertension: Secondary | ICD-10-CM

## 2019-07-07 DIAGNOSIS — K219 Gastro-esophageal reflux disease without esophagitis: Secondary | ICD-10-CM

## 2019-07-07 DIAGNOSIS — F419 Anxiety disorder, unspecified: Secondary | ICD-10-CM

## 2019-07-07 DIAGNOSIS — Z0000 Encounter for general adult medical examination without abnormal findings: Secondary | ICD-10-CM | POA: Diagnosis not present

## 2019-07-07 DIAGNOSIS — F331 Major depressive disorder, recurrent, moderate: Secondary | ICD-10-CM

## 2019-07-07 DIAGNOSIS — E7800 Pure hypercholesterolemia, unspecified: Secondary | ICD-10-CM | POA: Diagnosis not present

## 2019-07-07 DIAGNOSIS — Z131 Encounter for screening for diabetes mellitus: Secondary | ICD-10-CM | POA: Diagnosis not present

## 2019-07-07 DIAGNOSIS — J3089 Other allergic rhinitis: Secondary | ICD-10-CM

## 2019-07-07 DIAGNOSIS — E559 Vitamin D deficiency, unspecified: Secondary | ICD-10-CM

## 2019-07-07 DIAGNOSIS — J302 Other seasonal allergic rhinitis: Secondary | ICD-10-CM

## 2019-07-07 DIAGNOSIS — Z1231 Encounter for screening mammogram for malignant neoplasm of breast: Secondary | ICD-10-CM

## 2019-07-07 MED ORDER — ALPRAZOLAM 0.25 MG PO TABS: 0.2500 mg | ORAL_TABLET | Freq: Every day | ORAL | 0 refills | Status: DC | PRN

## 2019-07-07 NOTE — Patient Instructions (Addendum)
Preventive Care 40-56 Years Old, Female Preventive care refers to visits with your health care provider and lifestyle choices that can promote health and wellness. This includes:  A yearly physical exam. This may also be called an annual well check.  Regular dental visits and eye exams.  Immunizations.  Screening for certain conditions.  Healthy lifestyle choices, such as eating a healthy diet, getting regular exercise, not using drugs or products that contain nicotine and tobacco, and limiting alcohol use. What can I expect for my preventive care visit? Physical exam Your health care provider will check your:  Height and weight. This may be used to calculate body mass index (BMI), which tells if you are at a healthy weight.  Heart rate and blood pressure.  Skin for abnormal spots. Counseling Your health care provider may ask you questions about your:  Alcohol, tobacco, and drug use.  Emotional well-being.  Home and relationship well-being.  Sexual activity.  Eating habits.  Work and work environment.  Method of birth control.  Menstrual cycle.  Pregnancy history. What immunizations do I need?  Influenza (flu) vaccine  This is recommended every year. Tetanus, diphtheria, and pertussis (Tdap) vaccine  You may need a Td booster every 10 years. Varicella (chickenpox) vaccine  You may need this if you have not been vaccinated. Zoster (shingles) vaccine  You may need this after age 60. Measles, mumps, and rubella (MMR) vaccine  You may need at least one dose of MMR if you were born in 1957 or later. You may also need a second dose. Pneumococcal conjugate (PCV13) vaccine  You may need this if you have certain conditions and were not previously vaccinated. Pneumococcal polysaccharide (PPSV23) vaccine  You may need one or two doses if you smoke cigarettes or if you have certain conditions. Meningococcal conjugate (MenACWY) vaccine  You may need this if you  have certain conditions. Hepatitis A vaccine  You may need this if you have certain conditions or if you travel or work in places where you may be exposed to hepatitis A. Hepatitis B vaccine  You may need this if you have certain conditions or if you travel or work in places where you may be exposed to hepatitis B. Haemophilus influenzae type b (Hib) vaccine  You may need this if you have certain conditions. Human papillomavirus (HPV) vaccine  If recommended by your health care provider, you may need three doses over 6 months. You may receive vaccines as individual doses or as more than one vaccine together in one shot (combination vaccines). Talk with your health care provider about the risks and benefits of combination vaccines. What tests do I need? Blood tests  Lipid and cholesterol levels. These may be checked every 5 years, or more frequently if you are over 50 years old.  Hepatitis C test.  Hepatitis B test. Screening  Lung cancer screening. You may have this screening every year starting at age 55 if you have a 30-pack-year history of smoking and currently smoke or have quit within the past 15 years.  Colorectal cancer screening. All adults should have this screening starting at age 50 and continuing until age 75. Your health care provider may recommend screening at age 45 if you are at increased risk. You will have tests every 1-10 years, depending on your results and the type of screening test.  Diabetes screening. This is done by checking your blood sugar (glucose) after you have not eaten for a while (fasting). You may have this   done every 1-3 years.  Mammogram. This may be done every 1-2 years. Talk with your health care provider about when you should start having regular mammograms. This may depend on whether you have a family history of breast cancer.  BRCA-related cancer screening. This may be done if you have a family history of breast, ovarian, tubal, or peritoneal  cancers.  Pelvic exam and Pap test. This may be done every 3 years starting at age 49. Starting at age 92, this may be done every 5 years if you have a Pap test in combination with an HPV test. Other tests  Sexually transmitted disease (STD) testing.  Bone density scan. This is done to screen for osteoporosis. You may have this scan if you are at high risk for osteoporosis. Follow these instructions at home: Eating and drinking  Eat a diet that includes fresh fruits and vegetables, whole grains, lean protein, and low-fat dairy.  Take vitamin and mineral supplements as recommended by your health care provider.  Do not drink alcohol if: ? Your health care provider tells you not to drink. ? You are pregnant, may be pregnant, or are planning to become pregnant.  If you drink alcohol: ? Limit how much you have to 0-1 drink a day. ? Be aware of how much alcohol is in your drink. In the [place] one drink equals one 12 oz bottle of beer (355 mL), one 5 oz glass of wine (148 mL), or one 1 oz glass of hard liquor (44 mL). Lifestyle  Take daily care of your teeth and gums.  Stay active. Exercise for at least 30 minutes on 5 or more days each week.  Do not use any products that contain nicotine or tobacco, such as cigarettes, e-cigarettes, and chewing tobacco. If you need help quitting, ask your health care provider.  If you are sexually active, practice safe sex. Use a condom or other form of birth control (contraception) in order to prevent pregnancy and STIs (sexually transmitted infections).  If told by your health care provider, take low-dose aspirin daily starting at age 47. What's next?  Visit your health care provider once a year for a well check visit.  Ask your health care provider how often you should have your eyes and teeth checked.  Stay up to date on all vaccines. This information is not intended to replace advice given to you by your health care provider. Make sure you  discuss any questions you have with your health care provider. Document Revised: 12/11/2017 Document Reviewed: 12/11/2017 Elsevier Patient Education  [employer]. COVID-19 Vaccine Information can be found at: ShippingScam.co.uk For questions related to vaccine distribution or appointments, please email [email or web address] or call 7785729241.   Preventive Care 37-66 Years Old, Female Preventive care refers to visits with your health care provider and lifestyle choices that can promote health and wellness. This includes:  A yearly physical exam. This may also be called an annual well check.  Regular dental visits and eye exams.  Immunizations.  Screening for certain conditions.  Healthy lifestyle choices, such as eating a healthy diet, getting regular exercise, not using drugs or products that contain nicotine and tobacco, and limiting alcohol use. What can I expect for my preventive care visit? Physical exam Your health care provider will check your:  Height and weight. This may be used to calculate body mass index (BMI), which tells if you are at a healthy weight.  Heart rate and blood pressure.  Skin for abnormal spots.  Counseling Your health care provider may ask you questions about your:  Alcohol, tobacco, and drug use.  Emotional well-being.  Home and relationship well-being.  Sexual activity.  Eating habits.  Work and work Statistician.  Method of birth control.  Menstrual cycle.  Pregnancy history. What immunizations do I need?  Influenza (flu) vaccine  This is recommended every year. Tetanus, diphtheria, and pertussis (Tdap) vaccine  You may need a Td booster every 10 years. Varicella (chickenpox) vaccine  You may need this if you have not been vaccinated. Zoster (shingles) vaccine  You may need this after age 11. Measles, mumps, and rubella (MMR) vaccine  You may need at least  one dose of MMR if you were born in 1957 or later. You may also need a second dose. Pneumococcal conjugate (PCV13) vaccine  You may need this if you have certain conditions and were not previously vaccinated. Pneumococcal polysaccharide (PPSV23) vaccine  You may need one or two doses if you smoke cigarettes or if you have certain conditions. Meningococcal conjugate (MenACWY) vaccine  You may need this if you have certain conditions. Hepatitis A vaccine  You may need this if you have certain conditions or if you travel or work in places where you may be exposed to hepatitis A. Hepatitis B vaccine  You may need this if you have certain conditions or if you travel or work in places where you may be exposed to hepatitis B. Haemophilus influenzae type b (Hib) vaccine  You may need this if you have certain conditions. Human papillomavirus (HPV) vaccine  If recommended by your health care provider, you may need three doses over 6 months. You may receive vaccines as individual doses or as more than one vaccine together in one shot (combination vaccines). Talk with your health care provider about the risks and benefits of combination vaccines. What tests do I need? Blood tests  Lipid and cholesterol levels. These may be checked every 5 years, or more frequently if you are over 31 years old.  Hepatitis C test.  Hepatitis B test. Screening  Lung cancer screening. You may have this screening every year starting at age 66 if you have a 30-pack-year history of smoking and currently smoke or have quit within the past 15 years.  Colorectal cancer screening. All adults should have this screening starting at age 74 and continuing until age 73. Your health care provider may recommend screening at age 53 if you are at increased risk. You will have tests every 1-10 years, depending on your results and the type of screening test.  Diabetes screening. This is done by checking your blood sugar (glucose)  after you have not eaten for a while (fasting). You may have this done every 1-3 years.  Mammogram. This may be done every 1-2 years. Talk with your health care provider about when you should start having regular mammograms. This may depend on whether you have a family history of breast cancer.  BRCA-related cancer screening. This may be done if you have a family history of breast, ovarian, tubal, or peritoneal cancers.  Pelvic exam and Pap test. This may be done every 3 years starting at age 42. Starting at age 76, this may be done every 5 years if you have a Pap test in combination with an HPV test. Other tests  Sexually transmitted disease (STD) testing.  Bone density scan. This is done to screen for osteoporosis. You may have this scan if you are at high risk for osteoporosis.  Follow these instructions at home: Eating and drinking  Eat a diet that includes fresh fruits and vegetables, whole grains, lean protein, and low-fat dairy.  Take vitamin and mineral supplements as recommended by your health care provider.  Do not drink alcohol if: ? Your health care provider tells you not to drink. ? You are pregnant, may be pregnant, or are planning to become pregnant.  If you drink alcohol: ? Limit how much you have to 0-1 drink a day. ? Be aware of how much alcohol is in your drink. In the [place] one drink equals one 12 oz bottle of beer (355 mL), one 5 oz glass of wine (148 mL), or one 1 oz glass of hard liquor (44 mL). Lifestyle  Take daily care of your teeth and gums.  Stay active. Exercise for at least 30 minutes on 5 or more days each week.  Do not use any products that contain nicotine or tobacco, such as cigarettes, e-cigarettes, and chewing tobacco. If you need help quitting, ask your health care provider.  If you are sexually active, practice safe sex. Use a condom or other form of birth control (contraception) in order to prevent pregnancy and STIs (sexually transmitted  infections).  If told by your health care provider, take low-dose aspirin daily starting at age 74. What's next?  Visit your health care provider once a year for a well check visit.  Ask your health care provider how often you should have your eyes and teeth checked.  Stay up to date on all vaccines. This information is not intended to replace advice given to you by your health care provider. Make sure you discuss any questions you have with your health care provider. Document Revised: 12/11/2017 Document Reviewed: 12/11/2017 Elsevier Patient Education  2020 [employer].
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
## 2019-07-08 LAB — COMPLETE METABOLIC PANEL WITH GFR
AG Ratio: 1.3 (calc) (ref 1.0–2.5)
ALT: 12 U/L (ref 6–29)
AST: 14 U/L (ref 10–35)
Albumin: 4 g/dL (ref 3.6–5.1)
Alkaline phosphatase (APISO): 92 U/L (ref 37–153)
BUN: 16 mg/dL (ref 7–25)
CO2: 26 mmol/L (ref 20–32)
Calcium: 9.1 mg/dL (ref 8.6–10.4)
Chloride: 103 mmol/L (ref 98–110)
Creat: 0.71 mg/dL (ref 0.50–1.05)
GFR, Est African American: 111 mL/min/{1.73_m2} (ref >=60)
GFR, Est Non African American: 96 mL/min/{1.73_m2} (ref >=60)
Globulin: 3.2 g/dL (calc) (ref 1.9–3.7)
Glucose, Bld: 90 mg/dL (ref 65–99)
Potassium: 3.9 mmol/L (ref 3.5–5.3)
Sodium: 138 mmol/L (ref 135–146)
Total Bilirubin: 0.4 mg/dL (ref 0.2–1.2)
Total Protein: 7.2 g/dL (ref 6.1–8.1)

## 2019-07-08 LAB — CBC WITH DIFFERENTIAL/PLATELET
Absolute Monocytes: 508 cells/uL (ref 200–950)
Basophils Absolute: 39 cells/uL (ref 0–200)
Basophils Relative: 0.5 %
Eosinophils Absolute: 77 cells/uL (ref 15–500)
Eosinophils Relative: 1 %
HCT: 36.4 % (ref 35.0–45.0)
Hemoglobin: 12.4 g/dL (ref 11.7–15.5)
Lymphs Abs: 1902 cells/uL (ref 850–3900)
MCH: 31 pg (ref 27.0–33.0)
MCHC: 34.1 g/dL (ref 32.0–36.0)
MCV: 91 fL (ref 80.0–100.0)
MPV: 10 fL (ref 7.5–12.5)
Monocytes Relative: 6.6 %
Neutro Abs: 5174 cells/uL (ref 1500–7800)
Neutrophils Relative %: 67.2 %
Platelets: 222 10*3/uL (ref 140–400)
RBC: 4 10*6/uL (ref 3.80–5.10)
RDW: 13.4 % (ref 11.0–15.0)
Total Lymphocyte: 24.7 %
WBC: 7.7 10*3/uL (ref 3.8–10.8)

## 2019-07-08 LAB — LIPID PANEL
Cholesterol: 228 mg/dL — ABNORMAL HIGH (ref <200)
HDL: 62 mg/dL (ref >=50)
LDL Cholesterol (Calc): 143 mg/dL (calc) — ABNORMAL HIGH
Non-HDL Cholesterol (Calc): 166 mg/dL (calc) — ABNORMAL HIGH (ref <130)
Total CHOL/HDL Ratio: 3.7 (calc) (ref <5.0)
Triglycerides: 112 mg/dL (ref <150)

## 2019-07-08 LAB — HEMOGLOBIN A1C
Hgb A1c MFr Bld: 5.6 % of total Hgb (ref <5.7)
Mean Plasma Glucose: 114 (calc)
eAG (mmol/L): 6.3 (calc)

## 2019-07-24 ENCOUNTER — Ambulatory Visit: Payer: Self-pay | Attending: Internal Medicine

## 2019-07-24 DIAGNOSIS — Z23 Encounter for immunization: Secondary | ICD-10-CM

## 2019-07-24 NOTE — Progress Notes (Signed)
   Covid-19 Vaccination Clinic  Name:  Suzanne Griffin    MRN: [id number] DOB: [birth year]  07/24/2019  Suzanne Griffin was observed post Covid-19 immunization for 15 minutes without incident. She was provided with Vaccine Information Sheet and instruction to access the V-Safe system.   Suzanne Griffin was instructed to call 911 with any severe reactions post vaccine: Marland Kitchen Difficulty breathing  . Swelling of face and throat  . A fast heartbeat  . A bad rash all over body  . Dizziness and weakness   Immunizations Administered    Name Date Dose VIS Date Route   Pfizer COVID-19 Vaccine 07/24/2019  9:29 AM 0.3 mL 03/26/2019 Intramuscular   Manufacturer: [employer], [employer]   Lot: [id number]   NDC: [address]-8

## 2019-08-17 ENCOUNTER — Ambulatory Visit: Payer: Self-pay | Attending: Internal Medicine

## 2019-08-17 DIAGNOSIS — Z23 Encounter for immunization: Secondary | ICD-10-CM

## 2019-08-17 NOTE — Progress Notes (Signed)
   Covid-19 Vaccination Clinic  Name:  CALEY CIARAMITARO    MRN: [id number] DOB: 1965-10-01  08/17/2019  Ms. Kindred was observed post Covid-19 immunization for 15 minutes without incident. She was provided with Vaccine Information Sheet and instruction to access the V-Safe system.   Ms. Oliveto was instructed to call 911 with any severe reactions post vaccine: Marland Kitchen Difficulty breathing  . Swelling of face and throat  . A fast heartbeat  . A bad rash all over body  . Dizziness and weakness   Immunizations Administered    Name Date Dose VIS Date Route   Pfizer COVID-19 Vaccine 08/17/2019  3:29 PM 0.3 mL 06/09/2018 Intramuscular   Manufacturer: [employer], [employer]   Lot: [id number]   NDC: [address]-6

## 2019-09-28 ENCOUNTER — Other Ambulatory Visit: Payer: Self-pay | Admitting: Family Medicine

## 2019-09-28 DIAGNOSIS — K219 Gastro-esophageal reflux disease without esophagitis: Secondary | ICD-10-CM

## 2019-09-29 ENCOUNTER — Other Ambulatory Visit: Payer: Self-pay

## 2019-09-29 ENCOUNTER — Ambulatory Visit: Payer: BC Managed Care – PPO | Admitting: Family Medicine

## 2019-09-29 DIAGNOSIS — I10 Essential (primary) hypertension: Secondary | ICD-10-CM

## 2019-09-29 MED ORDER — ATENOLOL 50 MG PO TABS: 50.0000 mg | ORAL_TABLET | Freq: Every day | ORAL | 0 refills | Status: DC

## 2019-10-06 ENCOUNTER — Encounter: Payer: Self-pay | Admitting: Family Medicine

## 2019-10-06 ENCOUNTER — Ambulatory Visit (INDEPENDENT_AMBULATORY_CARE_PROVIDER_SITE_OTHER): Payer: BC Managed Care – PPO | Admitting: Family Medicine

## 2019-10-06 ENCOUNTER — Other Ambulatory Visit: Payer: Self-pay

## 2019-10-06 DIAGNOSIS — E7800 Pure hypercholesterolemia, unspecified: Secondary | ICD-10-CM

## 2019-10-06 DIAGNOSIS — I10 Essential (primary) hypertension: Secondary | ICD-10-CM

## 2019-10-06 DIAGNOSIS — J3089 Other allergic rhinitis: Secondary | ICD-10-CM

## 2019-10-06 DIAGNOSIS — F325 Major depressive disorder, single episode, in full remission: Secondary | ICD-10-CM

## 2019-10-06 DIAGNOSIS — J302 Other seasonal allergic rhinitis: Secondary | ICD-10-CM

## 2019-10-06 DIAGNOSIS — F419 Anxiety disorder, unspecified: Secondary | ICD-10-CM

## 2019-10-06 DIAGNOSIS — F411 Generalized anxiety disorder: Secondary | ICD-10-CM

## 2019-10-06 DIAGNOSIS — E559 Vitamin D deficiency, unspecified: Secondary | ICD-10-CM

## 2019-10-06 DIAGNOSIS — K219 Gastro-esophageal reflux disease without esophagitis: Secondary | ICD-10-CM

## 2019-10-06 MED ORDER — ATENOLOL 50 MG PO TABS: 50.0000 mg | ORAL_TABLET | Freq: Every day | ORAL | 1 refills | Status: DC

## 2019-10-06 MED ORDER — PANTOPRAZOLE SODIUM 20 MG PO TBEC: 20.0000 mg | DELAYED_RELEASE_TABLET | Freq: Every day | ORAL | 1 refills | Status: DC

## 2019-10-06 MED ORDER — ALPRAZOLAM 0.25 MG PO TABS: 0.2500 mg | ORAL_TABLET | Freq: Every day | ORAL | 0 refills | Status: DC | PRN
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
## 2019-11-25 ENCOUNTER — Other Ambulatory Visit: Payer: Self-pay | Admitting: Family Medicine

## 2019-11-25 DIAGNOSIS — G4700 Insomnia, unspecified: Secondary | ICD-10-CM

## 2019-11-25 DIAGNOSIS — F419 Anxiety disorder, unspecified: Secondary | ICD-10-CM

## 2020-02-11 ENCOUNTER — Ambulatory Visit (INDEPENDENT_AMBULATORY_CARE_PROVIDER_SITE_OTHER): Payer: BC Managed Care – PPO | Admitting: Family Medicine

## 2020-02-11 ENCOUNTER — Encounter: Payer: Self-pay | Admitting: Family Medicine

## 2020-02-11 ENCOUNTER — Other Ambulatory Visit: Payer: Self-pay

## 2020-02-11 VITALS — BP 139/86 | HR 82 | Temp 98.4°F | Resp 16 | Ht 62.0 in | Wt 190.5 lb

## 2020-02-11 DIAGNOSIS — E559 Vitamin D deficiency, unspecified: Secondary | ICD-10-CM

## 2020-02-11 DIAGNOSIS — G4700 Insomnia, unspecified: Secondary | ICD-10-CM

## 2020-02-11 DIAGNOSIS — J302 Other seasonal allergic rhinitis: Secondary | ICD-10-CM

## 2020-02-11 DIAGNOSIS — J3089 Other allergic rhinitis: Secondary | ICD-10-CM

## 2020-02-11 DIAGNOSIS — F325 Major depressive disorder, single episode, in full remission: Secondary | ICD-10-CM

## 2020-02-11 DIAGNOSIS — K219 Gastro-esophageal reflux disease without esophagitis: Secondary | ICD-10-CM

## 2020-02-11 DIAGNOSIS — Z23 Encounter for immunization: Secondary | ICD-10-CM | POA: Diagnosis not present

## 2020-02-11 DIAGNOSIS — F411 Generalized anxiety disorder: Secondary | ICD-10-CM | POA: Diagnosis not present

## 2020-02-11 DIAGNOSIS — E7800 Pure hypercholesterolemia, unspecified: Secondary | ICD-10-CM

## 2020-02-11 DIAGNOSIS — I10 Essential (primary) hypertension: Secondary | ICD-10-CM | POA: Diagnosis not present

## 2020-02-11 MED ORDER — ALPRAZOLAM 0.25 MG PO TABS: 0.2500 mg | ORAL_TABLET | Freq: Every day | ORAL | 0 refills | Status: DC | PRN
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
## 2020-02-23 ENCOUNTER — Other Ambulatory Visit: Payer: Self-pay | Admitting: Family Medicine

## 2020-02-23 DIAGNOSIS — G4700 Insomnia, unspecified: Secondary | ICD-10-CM

## 2020-02-23 DIAGNOSIS — F419 Anxiety disorder, unspecified: Secondary | ICD-10-CM

## 2020-02-23 NOTE — Telephone Encounter (Signed)
Requested Prescriptions  Pending Prescriptions Disp Refills  . hydrOXYzine (ATARAX/VISTARIL) 10 MG tablet [Pharmacy Med Name: HYDROXYZINE HCL 10 MG TABLET] 90 tablet 2    Sig: TAKE 1 TABLET (10 MG TOTAL) BY MOUTH 3 (THREE) TIMES DAILY AS NEEDED (MILD TO MODERATE ANXIETY).     Ear, Nose, and Throat:  Antihistamines Passed - 02/23/2020  1:36 AM      Passed - Valid encounter within last 12 months    Recent Outpatient Visits          1 week ago Essential hypertension   [facility] Alba Cory, MD   4 months ago Morbid obesity [facility])   [facility] Alba Cory, MD   7 months ago Well adult exam   [facility] Alba Cory, MD   10 months ago Insomnia, unspecified type   [facility] Alba Cory, MD   1 year ago Essential hypertension   [facility] Doren Custard, [place]      Future Appointments            In 5 months Alba Cory, MD [facility]

## 2020-04-18 DIAGNOSIS — Z03818 Encounter for observation for suspected exposure to other biological agents ruled out: Secondary | ICD-10-CM | POA: Diagnosis not present

## 2020-04-18 DIAGNOSIS — Z20822 Contact with and (suspected) exposure to covid-19: Secondary | ICD-10-CM | POA: Diagnosis not present

## 2020-04-18 DIAGNOSIS — U071 COVID-19: Secondary | ICD-10-CM | POA: Diagnosis not present

## 2020-04-22 ENCOUNTER — Other Ambulatory Visit: Payer: Self-pay | Admitting: Family Medicine

## 2020-04-22 DIAGNOSIS — I10 Essential (primary) hypertension: Secondary | ICD-10-CM

## 2020-04-22 DIAGNOSIS — F411 Generalized anxiety disorder: Secondary | ICD-10-CM

## 2020-04-22 DIAGNOSIS — K219 Gastro-esophageal reflux disease without esophagitis: Secondary | ICD-10-CM

## 2020-05-23 ENCOUNTER — Other Ambulatory Visit: Payer: Self-pay | Admitting: Family Medicine

## 2020-05-23 DIAGNOSIS — F419 Anxiety disorder, unspecified: Secondary | ICD-10-CM

## 2020-05-23 DIAGNOSIS — G4700 Insomnia, unspecified: Secondary | ICD-10-CM

## 2020-06-19 ENCOUNTER — Other Ambulatory Visit: Payer: Self-pay | Admitting: Family Medicine

## 2020-06-19 DIAGNOSIS — G4700 Insomnia, unspecified: Secondary | ICD-10-CM

## 2020-06-19 DIAGNOSIS — F419 Anxiety disorder, unspecified: Secondary | ICD-10-CM
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
## 2020-08-14 ENCOUNTER — Encounter: Payer: Self-pay | Admitting: Family Medicine

## 2020-08-14 ENCOUNTER — Ambulatory Visit (INDEPENDENT_AMBULATORY_CARE_PROVIDER_SITE_OTHER): Payer: BC Managed Care – PPO | Admitting: Family Medicine

## 2020-08-14 ENCOUNTER — Telehealth: Payer: Self-pay

## 2020-08-14 ENCOUNTER — Other Ambulatory Visit: Payer: Self-pay

## 2020-08-14 VITALS — BP 138/84 | HR 82 | Temp 99.2°F | Resp 16 | Ht 62.0 in | Wt 186.0 lb

## 2020-08-14 DIAGNOSIS — K219 Gastro-esophageal reflux disease without esophagitis: Secondary | ICD-10-CM

## 2020-08-14 DIAGNOSIS — Z0000 Encounter for general adult medical examination without abnormal findings: Secondary | ICD-10-CM

## 2020-08-14 DIAGNOSIS — Z131 Encounter for screening for diabetes mellitus: Secondary | ICD-10-CM | POA: Diagnosis not present

## 2020-08-14 DIAGNOSIS — Z114 Encounter for screening for human immunodeficiency virus [HIV]: Secondary | ICD-10-CM

## 2020-08-14 DIAGNOSIS — Z1231 Encounter for screening mammogram for malignant neoplasm of breast: Secondary | ICD-10-CM | POA: Diagnosis not present

## 2020-08-14 DIAGNOSIS — E7800 Pure hypercholesterolemia, unspecified: Secondary | ICD-10-CM

## 2020-08-14 DIAGNOSIS — Z1211 Encounter for screening for malignant neoplasm of colon: Secondary | ICD-10-CM | POA: Diagnosis not present

## 2020-08-14 DIAGNOSIS — F411 Generalized anxiety disorder: Secondary | ICD-10-CM

## 2020-08-14 DIAGNOSIS — E559 Vitamin D deficiency, unspecified: Secondary | ICD-10-CM | POA: Diagnosis not present

## 2020-08-14 DIAGNOSIS — Z23 Encounter for immunization: Secondary | ICD-10-CM

## 2020-08-14 DIAGNOSIS — I10 Essential (primary) hypertension: Secondary | ICD-10-CM

## 2020-08-14 MED ORDER — PANTOPRAZOLE SODIUM 20 MG PO TBEC: 20.0000 mg | DELAYED_RELEASE_TABLET | Freq: Every day | ORAL | 1 refills | Status: DC

## 2020-08-14 MED ORDER — ATENOLOL 50 MG PO TABS: 50.0000 mg | ORAL_TABLET | Freq: Every day | ORAL | 1 refills | Status: DC

## 2020-08-14 MED ORDER — ALPRAZOLAM 0.25 MG PO TABS: 0.2500 mg | ORAL_TABLET | Freq: Every day | ORAL | 0 refills | Status: DC | PRN

## 2020-08-14 NOTE — Telephone Encounter (Signed)
[facility] referring for adult well exam. Called and left voicemail for patient to call back to be scheduled.

## 2020-08-14 NOTE — Patient Instructions (Signed)
Preventive Care 84-57 Years Old, Female Preventive care refers to lifestyle choices and visits with your health care provider that can promote health and wellness. This includes:  A yearly physical exam. This is also called an annual wellness visit.  Regular dental and eye exams.  Immunizations.  Screening for certain conditions.  Healthy lifestyle choices, such as: ? Eating a healthy diet. ? Getting regular exercise. ? Not using drugs or products that contain nicotine and tobacco. ? Limiting alcohol use. What can I expect for my preventive care visit? Physical exam Your health care provider will check your:  Height and weight. These may be used to calculate your BMI (body mass index). BMI is a measurement that tells if you are at a healthy weight.  Heart rate and blood pressure.  Body temperature.  Skin for abnormal spots. Counseling Your health care provider may ask you questions about your:  Past medical problems.  Family's medical history.  Alcohol, tobacco, and drug use.  Emotional well-being.  Home life and relationship well-being.  Sexual activity.  Diet, exercise, and sleep habits.  Work and work Statistician.  Access to firearms.  Method of birth control.  Menstrual cycle.  Pregnancy history. What immunizations do I need? Vaccines are usually given at various ages, according to a schedule. Your health care provider will recommend vaccines for you based on your age, medical history, and lifestyle or other factors, such as travel or where you work.   What tests do I need? Blood tests  Lipid and cholesterol levels. These may be checked every 5 years, or more often if you are over 57 years old.  Hepatitis C test.  Hepatitis B test. Screening  Lung cancer screening. You may have this screening every year starting at age 57 if you have a 30-pack-year history of smoking and currently smoke or have quit within the past 15 years.  Colorectal cancer  screening. ? All adults should have this screening starting at age 57 and continuing until age 17. ? Your health care provider may recommend screening at age 57 if you are at increased risk. ? You will have tests every 1-10 years, depending on your results and the type of screening test.  Diabetes screening. ? This is done by checking your blood sugar (glucose) after you have not eaten for a while (fasting). ? You may have this done every 1-3 years.  Mammogram. ? This may be done every 1-2 years. ? Talk with your health care provider about when you should start having regular mammograms. This may depend on whether you have a family history of breast cancer.  BRCA-related cancer screening. This may be done if you have a family history of breast, ovarian, tubal, or peritoneal cancers.  Pelvic exam and Pap test. ? This may be done every 3 years starting at age 57. ? Starting at age 57, this may be done every 5 years if you have a Pap test in combination with an HPV test. Other tests  STD (sexually transmitted disease) testing, if you are at risk.  Bone density scan. This is done to screen for osteoporosis. You may have this scan if you are at high risk for osteoporosis. Talk with your health care provider about your test results, treatment options, and if necessary, the need for more tests. Follow these instructions at home: Eating and drinking  Eat a diet that includes fresh fruits and vegetables, whole grains, lean protein, and low-fat dairy products.  Take vitamin and mineral supplements  as recommended by your health care provider.  Do not drink alcohol if: ? Your health care provider tells you not to drink. ? You are pregnant, may be pregnant, or are planning to become pregnant.  If you drink alcohol: ? Limit how much you have to 0-1 drink a day. ? Be aware of how much alcohol is in your drink. In the [place] one drink equals one 12 oz bottle of beer (355 mL), one 5 oz glass of  wine (148 mL), or one 1 oz glass of hard liquor (44 mL).   Lifestyle  Take daily care of your teeth and gums. Brush your teeth every morning and night with fluoride toothpaste. Floss one time each day.  Stay active. Exercise for at least 30 minutes 5 or more days each week.  Do not use any products that contain nicotine or tobacco, such as cigarettes, e-cigarettes, and chewing tobacco. If you need help quitting, ask your health care provider.  Do not use drugs.  If you are sexually active, practice safe sex. Use a condom or other form of protection to prevent STIs (sexually transmitted infections).  If you do not wish to become pregnant, use a form of birth control. If you plan to become pregnant, see your health care provider for a prepregnancy visit.  If told by your health care provider, take low-dose aspirin daily starting at age 57.  Find healthy ways to cope with stress, such as: ? Meditation, yoga, or listening to music. ? Journaling. ? Talking to a trusted person. ? Spending time with friends and family. Safety  Always wear your seat belt while driving or riding in a vehicle.  Do not drive: ? If you have been drinking alcohol. Do not ride with someone who has been drinking. ? When you are tired or distracted. ? While texting.  Wear a helmet and other protective equipment during sports activities.  If you have firearms in your house, make sure you follow all gun safety procedures. What's next?  Visit your health care provider once a year for an annual wellness visit.  Ask your health care provider how often you should have your eyes and teeth checked.  Stay up to date on all vaccines. This information is not intended to replace advice given to you by your health care provider. Make sure you discuss any questions you have with your health care provider. Document Revised: 01/04/2020 Document Reviewed: 12/11/2017 Elsevier Patient Education  2021 Elsevier Inc.  

## 2020-08-15 LAB — LIPID PANEL
Cholesterol: 239 mg/dL — ABNORMAL HIGH (ref <200)
HDL: 58 mg/dL (ref >=50)
LDL Cholesterol (Calc): 153 mg/dL (calc) — ABNORMAL HIGH
Non-HDL Cholesterol (Calc): 181 mg/dL (calc) — ABNORMAL HIGH (ref <130)
Total CHOL/HDL Ratio: 4.1 (calc) (ref <5.0)
Triglycerides: 150 mg/dL — ABNORMAL HIGH (ref <150)

## 2020-08-15 LAB — COMPLETE METABOLIC PANEL WITH GFR
AG Ratio: 1.2 (calc) (ref 1.0–2.5)
ALT: 17 U/L (ref 6–29)
AST: 18 U/L (ref 10–35)
Albumin: 4 g/dL (ref 3.6–5.1)
Alkaline phosphatase (APISO): 80 U/L (ref 37–153)
BUN: 16 mg/dL (ref 7–25)
CO2: 26 mmol/L (ref 20–32)
Calcium: 9 mg/dL (ref 8.6–10.4)
Chloride: 104 mmol/L (ref 98–110)
Creat: 0.72 mg/dL (ref 0.50–1.05)
GFR, Est African American: 108 mL/min/{1.73_m2} (ref >=60)
GFR, Est Non African American: 94 mL/min/{1.73_m2} (ref >=60)
Globulin: 3.3 g/dL (calc) (ref 1.9–3.7)
Glucose, Bld: 103 mg/dL — ABNORMAL HIGH (ref 65–99)
Potassium: 3.7 mmol/L (ref 3.5–5.3)
Sodium: 139 mmol/L (ref 135–146)
Total Bilirubin: 0.3 mg/dL (ref 0.2–1.2)
Total Protein: 7.3 g/dL (ref 6.1–8.1)

## 2020-08-15 LAB — CBC WITH DIFFERENTIAL/PLATELET
Absolute Monocytes: 360 cells/uL (ref 200–950)
Basophils Absolute: 68 cells/uL (ref 0–200)
Basophils Relative: 0.9 %
Eosinophils Absolute: 128 cells/uL (ref 15–500)
Eosinophils Relative: 1.7 %
HCT: 35.8 % (ref 35.0–45.0)
Hemoglobin: 12.2 g/dL (ref 11.7–15.5)
Lymphs Abs: 2543 cells/uL (ref 850–3900)
MCH: 32.4 pg (ref 27.0–33.0)
MCHC: 34.1 g/dL (ref 32.0–36.0)
MCV: 95.2 fL (ref 80.0–100.0)
MPV: 9.5 fL (ref 7.5–12.5)
Monocytes Relative: 4.8 %
Neutro Abs: 4403 cells/uL (ref 1500–7800)
Neutrophils Relative %: 58.7 %
Platelets: 215 10*3/uL (ref 140–400)
RBC: 3.76 10*6/uL — ABNORMAL LOW (ref 3.80–5.10)
RDW: 13.4 % (ref 11.0–15.0)
Total Lymphocyte: 33.9 %
WBC: 7.5 10*3/uL (ref 3.8–10.8)

## 2020-08-15 LAB — HEMOGLOBIN A1C
Hgb A1c MFr Bld: 5.4 % of total Hgb (ref <5.7)
Mean Plasma Glucose: 108 mg/dL
eAG (mmol/L): 6 mmol/L

## 2020-08-15 LAB — HIV ANTIBODY (ROUTINE TESTING W REFLEX): HIV 1&2 Ab, 4th Generation: NONREACTIVE

## 2020-08-15 LAB — VITAMIN D 25 HYDROXY (VIT D DEFICIENCY, FRACTURES): Vit D, 25-Hydroxy: 27 ng/mL — ABNORMAL LOW (ref 30–100)

## 2020-08-15 NOTE — Telephone Encounter (Signed)
Called and left voicemail for patient to call back to be scheduled. 

## 2020-08-16 NOTE — Telephone Encounter (Signed)
Called and left voicemail for patient to call back to be scheduled. 

## 2020-09-20 ENCOUNTER — Other Ambulatory Visit: Payer: Self-pay | Admitting: Family Medicine

## 2020-09-20 DIAGNOSIS — F419 Anxiety disorder, unspecified: Secondary | ICD-10-CM

## 2020-09-20 DIAGNOSIS — G4700 Insomnia, unspecified: Secondary | ICD-10-CM

## 2020-09-20 NOTE — Telephone Encounter (Signed)
Requested Prescriptions  Pending Prescriptions Disp Refills  . hydrOXYzine (ATARAX/VISTARIL) 10 MG tablet [Pharmacy Med Name: HYDROXYZINE HCL 10 MG TABLET] 270 tablet 1    Sig: TAKE 1 TABLET (10 MG TOTAL) BY MOUTH 3 (THREE) TIMES DAILY AS NEEDED (MILD TO MODERATE ANXIETY).     Ear, Nose, and Throat:  Antihistamines Passed - 09/20/2020  6:15 AM      Passed - Valid encounter within last 12 months    Recent Outpatient Visits          1 month ago Well adult exam   [facility] Alba Cory, MD   7 months ago Essential hypertension   [facility] Alba Cory, MD   11 months ago Morbid obesity [facility])   [facility] Alba Cory, MD   1 year ago Well adult exam   [facility] Alba Cory, MD   1 year ago Insomnia, unspecified type   [facility] Alba Cory, MD      Future Appointments            In 4 months Alba Cory, MD [facility]
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
## 2021-02-16 ENCOUNTER — Encounter: Payer: Self-pay | Admitting: Family Medicine

## 2021-02-16 ENCOUNTER — Ambulatory Visit (INDEPENDENT_AMBULATORY_CARE_PROVIDER_SITE_OTHER): Payer: BC Managed Care – PPO | Admitting: Family Medicine

## 2021-02-16 ENCOUNTER — Other Ambulatory Visit: Payer: Self-pay

## 2021-02-16 VITALS — BP 136/80 | HR 89 | Temp 98.1°F | Resp 16 | Ht 62.0 in | Wt 188.0 lb

## 2021-02-16 DIAGNOSIS — Z23 Encounter for immunization: Secondary | ICD-10-CM

## 2021-02-16 DIAGNOSIS — K219 Gastro-esophageal reflux disease without esophagitis: Secondary | ICD-10-CM | POA: Diagnosis not present

## 2021-02-16 DIAGNOSIS — E559 Vitamin D deficiency, unspecified: Secondary | ICD-10-CM

## 2021-02-16 DIAGNOSIS — I10 Essential (primary) hypertension: Secondary | ICD-10-CM

## 2021-02-16 DIAGNOSIS — E7800 Pure hypercholesterolemia, unspecified: Secondary | ICD-10-CM

## 2021-02-16 DIAGNOSIS — F330 Major depressive disorder, recurrent, mild: Secondary | ICD-10-CM | POA: Insufficient documentation

## 2021-02-16 DIAGNOSIS — F411 Generalized anxiety disorder: Secondary | ICD-10-CM | POA: Diagnosis not present

## 2021-02-16 DIAGNOSIS — F4310 Post-traumatic stress disorder, unspecified: Secondary | ICD-10-CM

## 2021-02-16 MED ORDER — ESCITALOPRAM OXALATE 10 MG PO TABS: 10.0000 mg | ORAL_TABLET | Freq: Every day | ORAL | 1 refills | Status: DC

## 2021-02-16 MED ORDER — PANTOPRAZOLE SODIUM 20 MG PO TBEC: 20.0000 mg | DELAYED_RELEASE_TABLET | Freq: Every day | ORAL | 1 refills | Status: DC

## 2021-02-16 MED ORDER — ALPRAZOLAM 0.25 MG PO TABS
0.2500 mg | ORAL_TABLET | Freq: Every day | ORAL | 0 refills | Status: DC | PRN
Start: 2021-02-16 — End: 2021-08-23

## 2021-02-16 MED ORDER — ATENOLOL 50 MG PO TABS: 50.0000 mg | ORAL_TABLET | Freq: Every day | ORAL | 1 refills | Status: DC

## 2021-02-16 NOTE — Patient Instructions (Addendum)
[facility] [phone] Providers either [place] or [place]

## 2021-03-16 ENCOUNTER — Other Ambulatory Visit: Payer: Self-pay | Admitting: Family Medicine

## 2021-03-16 DIAGNOSIS — F419 Anxiety disorder, unspecified: Secondary | ICD-10-CM

## 2021-03-16 DIAGNOSIS — G4700 Insomnia, unspecified: Secondary | ICD-10-CM

## 2021-04-16 ENCOUNTER — Other Ambulatory Visit: Payer: Self-pay | Admitting: Family Medicine

## 2021-04-16 DIAGNOSIS — F419 Anxiety disorder, unspecified: Secondary | ICD-10-CM

## 2021-04-16 DIAGNOSIS — G4700 Insomnia, unspecified: Secondary | ICD-10-CM

## 2021-08-18 ENCOUNTER — Other Ambulatory Visit: Payer: Self-pay | Admitting: Family Medicine

## 2021-08-18 DIAGNOSIS — K219 Gastro-esophageal reflux disease without esophagitis: Secondary | ICD-10-CM

## 2021-08-18 DIAGNOSIS — F411 Generalized anxiety disorder: Secondary | ICD-10-CM

## 2021-08-18 DIAGNOSIS — F330 Major depressive disorder, recurrent, mild: Secondary | ICD-10-CM

## 2021-08-18 DIAGNOSIS — I10 Essential (primary) hypertension: Secondary | ICD-10-CM

## 2021-08-22 NOTE — Patient Instructions (Signed)
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
## 2021-08-23 ENCOUNTER — Encounter: Payer: Self-pay | Admitting: Family Medicine

## 2021-08-23 ENCOUNTER — Other Ambulatory Visit (HOSPITAL_COMMUNITY)
Admission: RE | Admit: 2021-08-23 | Discharge: 2021-08-23 | Disposition: A | Discharge status: Home / Self Care | Payer: Self-pay | Source: Ambulatory Visit | Attending: Family Medicine | Admitting: Family Medicine

## 2021-08-23 ENCOUNTER — Ambulatory Visit (INDEPENDENT_AMBULATORY_CARE_PROVIDER_SITE_OTHER): Payer: BC Managed Care – PPO | Admitting: Family Medicine

## 2021-08-23 DIAGNOSIS — Z79899 Other long term (current) drug therapy: Secondary | ICD-10-CM

## 2021-08-23 DIAGNOSIS — F4310 Post-traumatic stress disorder, unspecified: Secondary | ICD-10-CM | POA: Insufficient documentation

## 2021-08-23 DIAGNOSIS — Z1231 Encounter for screening mammogram for malignant neoplasm of breast: Secondary | ICD-10-CM

## 2021-08-23 DIAGNOSIS — Z1211 Encounter for screening for malignant neoplasm of colon: Secondary | ICD-10-CM

## 2021-08-23 DIAGNOSIS — F411 Generalized anxiety disorder: Secondary | ICD-10-CM | POA: Diagnosis not present

## 2021-08-23 DIAGNOSIS — I10 Essential (primary) hypertension: Secondary | ICD-10-CM

## 2021-08-23 DIAGNOSIS — Z124 Encounter for screening for malignant neoplasm of cervix: Secondary | ICD-10-CM | POA: Insufficient documentation

## 2021-08-23 DIAGNOSIS — J3089 Other allergic rhinitis: Secondary | ICD-10-CM

## 2021-08-23 DIAGNOSIS — Z131 Encounter for screening for diabetes mellitus: Secondary | ICD-10-CM | POA: Diagnosis not present

## 2021-08-23 DIAGNOSIS — K219 Gastro-esophageal reflux disease without esophagitis: Secondary | ICD-10-CM

## 2021-08-23 DIAGNOSIS — Z0000 Encounter for general adult medical examination without abnormal findings: Secondary | ICD-10-CM

## 2021-08-23 DIAGNOSIS — E7800 Pure hypercholesterolemia, unspecified: Secondary | ICD-10-CM

## 2021-08-23 DIAGNOSIS — R1032 Left lower quadrant pain: Secondary | ICD-10-CM

## 2021-08-23 DIAGNOSIS — E559 Vitamin D deficiency, unspecified: Secondary | ICD-10-CM

## 2021-08-23 DIAGNOSIS — F330 Major depressive disorder, recurrent, mild: Secondary | ICD-10-CM

## 2021-08-23 DIAGNOSIS — J302 Other seasonal allergic rhinitis: Secondary | ICD-10-CM | POA: Insufficient documentation

## 2021-08-23 MED ORDER — ATENOLOL 50 MG PO TABS: 50.0000 mg | ORAL_TABLET | Freq: Every day | ORAL | 1 refills | Status: DC

## 2021-08-23 MED ORDER — ESCITALOPRAM OXALATE 10 MG PO TABS: 10.0000 mg | ORAL_TABLET | Freq: Every day | ORAL | 1 refills | Status: DC

## 2021-08-23 MED ORDER — ALPRAZOLAM 0.25 MG PO TABS: 0.2500 mg | ORAL_TABLET | Freq: Every day | ORAL | 0 refills | Status: DC | PRN

## 2021-08-23 MED ORDER — PANTOPRAZOLE SODIUM 20 MG PO TBEC: 20.0000 mg | DELAYED_RELEASE_TABLET | Freq: Every day | ORAL | 1 refills | Status: DC

## 2021-08-24 LAB — LIPID PANEL
Cholesterol: 234 mg/dL — ABNORMAL HIGH (ref <200)
HDL: 65 mg/dL (ref >=50)
LDL Cholesterol (Calc): 143 mg/dL (calc) — ABNORMAL HIGH
Non-HDL Cholesterol (Calc): 169 mg/dL (calc) — ABNORMAL HIGH (ref <130)
Total CHOL/HDL Ratio: 3.6 (calc) (ref <5.0)
Triglycerides: 140 mg/dL (ref <150)

## 2021-08-24 LAB — COMPLETE METABOLIC PANEL WITH GFR
AG Ratio: 1.1 (calc) (ref 1.0–2.5)
ALT: 12 U/L (ref 6–29)
AST: 14 U/L (ref 10–35)
Albumin: 4.1 g/dL (ref 3.6–5.1)
Alkaline phosphatase (APISO): 95 U/L (ref 37–153)
BUN: 19 mg/dL (ref 7–25)
CO2: 26 mmol/L (ref 20–32)
Calcium: 9.1 mg/dL (ref 8.6–10.4)
Chloride: 101 mmol/L (ref 98–110)
Creat: 0.77 mg/dL (ref 0.50–1.03)
Globulin: 3.6 g/dL (calc) (ref 1.9–3.7)
Glucose, Bld: 107 mg/dL — ABNORMAL HIGH (ref 65–99)
Potassium: 4.2 mmol/L (ref 3.5–5.3)
Sodium: 136 mmol/L (ref 135–146)
Total Bilirubin: 0.5 mg/dL (ref 0.2–1.2)
Total Protein: 7.7 g/dL (ref 6.1–8.1)
eGFR: 90 mL/min/{1.73_m2} (ref >=60)

## 2021-08-24 LAB — CBC WITH DIFFERENTIAL/PLATELET
Absolute Monocytes: 538 cells/uL (ref 200–950)
Basophils Absolute: 31 cells/uL (ref 0–200)
Basophils Relative: 0.4 %
Eosinophils Absolute: 101 cells/uL (ref 15–500)
Eosinophils Relative: 1.3 %
HCT: 38.6 % (ref 35.0–45.0)
Hemoglobin: 13 g/dL (ref 11.7–15.5)
Lymphs Abs: 1693 cells/uL (ref 850–3900)
MCH: 31.8 pg (ref 27.0–33.0)
MCHC: 33.7 g/dL (ref 32.0–36.0)
MCV: 94.4 fL (ref 80.0–100.0)
MPV: 9.7 fL (ref 7.5–12.5)
Monocytes Relative: 6.9 %
Neutro Abs: 5437 cells/uL (ref 1500–7800)
Neutrophils Relative %: 69.7 %
Platelets: 255 10*3/uL (ref 140–400)
RBC: 4.09 10*6/uL (ref 3.80–5.10)
RDW: 13.1 % (ref 11.0–15.0)
Total Lymphocyte: 21.7 %
WBC: 7.8 10*3/uL (ref 3.8–10.8)

## 2021-08-24 LAB — HEMOGLOBIN A1C
Hgb A1c MFr Bld: 5.6 % of total Hgb (ref <5.7)
Mean Plasma Glucose: 114 mg/dL
eAG (mmol/L): 6.3 mmol/L

## 2021-08-27 LAB — CYTOLOGY - PAP
Comment: NEGATIVE
Diagnosis: NEGATIVE
High risk HPV: NEGATIVE

## 2021-10-19 ENCOUNTER — Other Ambulatory Visit: Payer: Self-pay | Admitting: Internal Medicine

## 2021-10-19 DIAGNOSIS — G4700 Insomnia, unspecified: Secondary | ICD-10-CM

## 2021-10-19 DIAGNOSIS — F419 Anxiety disorder, unspecified: Secondary | ICD-10-CM

## 2021-10-19 NOTE — Telephone Encounter (Signed)
Requested Prescriptions  Pending Prescriptions Disp Refills  . hydrOXYzine (ATARAX) 10 MG tablet [Pharmacy Med Name: HYDROXYZINE HCL 10 MG TABLET] 90 tablet 5    Sig: TAKE 1 TABLET (10 MG TOTAL) BY MOUTH 3 (THREE) TIMES DAILY AS NEEDED (MILD TO MODERATE ANXIETY).     Ear, Nose, and Throat:  Antihistamines 2 Passed - 10/19/2021  1:49 AM      Passed - Cr in normal range and within 360 days    Creat  Date Value Ref Range Status  08/23/2021 0.77 0.50 - 1.03 mg/dL Final         Passed - Valid encounter within last 12 months    Recent Outpatient Visits          1 month ago Morbid obesity [facility])   [facility] [place], Danna Hefty, MD   8 months ago Mild recurrent major depression [facility])   [facility] Alba Cory, MD   1 year ago Well adult exam   [facility] Alba Cory, MD   1 year ago Essential hypertension   [facility] Alba Cory, MD   2 years ago Morbid obesity [facility])   [facility] Alba Cory, MD      Future Appointments            In 4 months Carlynn Purl, Danna Hefty, MD [facility]   In 10 months Alba Cory, MD [facility], [facility]

## 2021-12-07 DIAGNOSIS — Z1211 Encounter for screening for malignant neoplasm of colon: Secondary | ICD-10-CM | POA: Diagnosis not present

## 2021-12-07 DIAGNOSIS — D122 Benign neoplasm of ascending colon: Secondary | ICD-10-CM | POA: Diagnosis not present

## 2021-12-07 DIAGNOSIS — K640 First degree hemorrhoids: Secondary | ICD-10-CM | POA: Diagnosis not present

## 2021-12-07 LAB — HM COLONOSCOPY

## 2022-02-15 ENCOUNTER — Other Ambulatory Visit: Payer: Self-pay | Admitting: Family Medicine

## 2022-02-15 DIAGNOSIS — I10 Essential (primary) hypertension: Secondary | ICD-10-CM

## 2022-02-15 DIAGNOSIS — F411 Generalized anxiety disorder: Secondary | ICD-10-CM

## 2022-02-15 DIAGNOSIS — F330 Major depressive disorder, recurrent, mild: Secondary | ICD-10-CM

## 2022-02-15 DIAGNOSIS — K219 Gastro-esophageal reflux disease without esophagitis: Secondary | ICD-10-CM
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
## 2022-03-01 ENCOUNTER — Ambulatory Visit (INDEPENDENT_AMBULATORY_CARE_PROVIDER_SITE_OTHER): Payer: BC Managed Care – PPO | Admitting: Family Medicine

## 2022-03-01 ENCOUNTER — Encounter: Payer: Self-pay | Admitting: Family Medicine

## 2022-03-01 VITALS — BP 132/84 | HR 81 | Resp 16 | Ht 62.0 in | Wt 197.0 lb

## 2022-03-01 DIAGNOSIS — Z23 Encounter for immunization: Secondary | ICD-10-CM

## 2022-03-01 DIAGNOSIS — I10 Essential (primary) hypertension: Secondary | ICD-10-CM

## 2022-03-01 DIAGNOSIS — K219 Gastro-esophageal reflux disease without esophagitis: Secondary | ICD-10-CM

## 2022-03-01 DIAGNOSIS — F330 Major depressive disorder, recurrent, mild: Secondary | ICD-10-CM

## 2022-03-01 DIAGNOSIS — F411 Generalized anxiety disorder: Secondary | ICD-10-CM | POA: Diagnosis not present

## 2022-03-01 MED ORDER — ALPRAZOLAM 0.25 MG PO TABS: 0.2500 mg | ORAL_TABLET | Freq: Every day | ORAL | 0 refills | Status: DC | PRN

## 2022-03-01 MED ORDER — ATENOLOL 50 MG PO TABS: 50.0000 mg | ORAL_TABLET | Freq: Every day | ORAL | 1 refills | Status: DC

## 2022-03-01 MED ORDER — PANTOPRAZOLE SODIUM 20 MG PO TBEC: 20.0000 mg | DELAYED_RELEASE_TABLET | Freq: Every day | ORAL | 1 refills | Status: DC

## 2022-03-01 MED ORDER — ESCITALOPRAM OXALATE 10 MG PO TABS
10.0000 mg | ORAL_TABLET | Freq: Every day | ORAL | 1 refills | Status: DC
Start: 2022-03-01 — End: 2022-09-11

## 2022-07-22 ENCOUNTER — Other Ambulatory Visit: Payer: Self-pay | Admitting: Family Medicine

## 2022-07-22 DIAGNOSIS — G4700 Insomnia, unspecified: Secondary | ICD-10-CM

## 2022-07-22 DIAGNOSIS — F419 Anxiety disorder, unspecified: Secondary | ICD-10-CM

## 2022-08-19 ENCOUNTER — Other Ambulatory Visit: Payer: Self-pay | Admitting: Family Medicine

## 2022-08-19 DIAGNOSIS — F419 Anxiety disorder, unspecified: Secondary | ICD-10-CM

## 2022-08-19 DIAGNOSIS — G4700 Insomnia, unspecified: Secondary | ICD-10-CM

## 2022-08-23 ENCOUNTER — Other Ambulatory Visit: Payer: Self-pay | Admitting: Family Medicine

## 2022-08-23 DIAGNOSIS — F419 Anxiety disorder, unspecified: Secondary | ICD-10-CM

## 2022-08-23 DIAGNOSIS — G4700 Insomnia, unspecified: Secondary | ICD-10-CM

## 2022-08-29 NOTE — Patient Instructions (Signed)
Preventive Care 40-59 Years Old, Female Preventive care refers to lifestyle choices and visits with your health care provider that can promote health and wellness. Preventive care visits are also called wellness exams. What can I expect for my preventive care visit? Counseling Your health care provider may ask you questions about your: Medical history, including: Past medical problems. Family medical history. Pregnancy history. Current health, including: Menstrual cycle. Method of birth control. Emotional well-being. Home life and relationship well-being. Sexual activity and sexual health. Lifestyle, including: Alcohol, nicotine or tobacco, and drug use. Access to firearms. Diet, exercise, and sleep habits. Work and work environment. Sunscreen use. Safety issues such as seatbelt and bike helmet use. Physical exam Your health care provider will check your: Height and weight. These may be used to calculate your BMI (body mass index). BMI is a measurement that tells if you are at a healthy weight. Waist circumference. This measures the distance around your waistline. This measurement also tells if you are at a healthy weight and may help predict your risk of certain diseases, such as type 2 diabetes and high blood pressure. Heart rate and blood pressure. Body temperature. Skin for abnormal spots. What immunizations do I need?  Vaccines are usually given at various ages, according to a schedule. Your health care provider will recommend vaccines for you based on your age, medical history, and lifestyle or other factors, such as travel or where you work. What tests do I need? Screening Your health care provider may recommend screening tests for certain conditions. This may include: Lipid and cholesterol levels. Diabetes screening. This is done by checking your blood sugar (glucose) after you have not eaten for a while (fasting). Pelvic exam and Pap test. Hepatitis B test. Hepatitis C  test. HIV (human immunodeficiency virus) test. STI (sexually transmitted infection) testing, if you are at risk. Lung cancer screening. Colorectal cancer screening. Mammogram. Talk with your health care provider about when you should start having regular mammograms. This may depend on whether you have a family history of breast cancer. BRCA-related cancer screening. This may be done if you have a family history of breast, ovarian, tubal, or peritoneal cancers. Bone density scan. This is done to screen for osteoporosis. Talk with your health care provider about your test results, treatment options, and if necessary, the need for more tests. Follow these instructions at home: Eating and drinking  Eat a diet that includes fresh fruits and vegetables, whole grains, lean protein, and low-fat dairy products. Take vitamin and mineral supplements as recommended by your health care provider. Do not drink alcohol if: Your health care provider tells you not to drink. You are pregnant, may be pregnant, or are planning to become pregnant. If you drink alcohol: Limit how much you have to 0-1 drink a day. Know how much alcohol is in your drink. In the [place] one drink equals one 12 oz bottle of beer (355 mL), one 5 oz glass of wine (148 mL), or one 1 oz glass of hard liquor (44 mL). Lifestyle Brush your teeth every morning and night with fluoride toothpaste. Floss one time each day. Exercise for at least 30 minutes 5 or more days each week. Do not use any products that contain nicotine or tobacco. These products include cigarettes, chewing tobacco, and vaping devices, such as e-cigarettes. If you need help quitting, ask your health care provider. Do not use drugs. If you are sexually active, practice safe sex. Use a condom or other form of protection to   prevent STIs. If you do not wish to become pregnant, use a form of birth control. If you plan to become pregnant, see your health care provider for a  prepregnancy visit. Take aspirin only as told by your health care provider. Make sure that you understand how much to take and what form to take. Work with your health care provider to find out whether it is safe and beneficial for you to take aspirin daily. Find healthy ways to manage stress, such as: Meditation, yoga, or listening to music. Journaling. Talking to a trusted person. Spending time with friends and family. Minimize exposure to UV radiation to reduce your risk of skin cancer. Safety Always wear your seat belt while driving or riding in a vehicle. Do not drive: If you have been drinking alcohol. Do not ride with someone who has been drinking. When you are tired or distracted. While texting. If you have been using any mind-altering substances or drugs. Wear a helmet and other protective equipment during sports activities. If you have firearms in your house, make sure you follow all gun safety procedures. Seek help if you have been physically or sexually abused. What's next? Visit your health care provider once a year for an annual wellness visit. Ask your health care provider how often you should have your eyes and teeth checked. Stay up to date on all vaccines. This information is not intended to replace advice given to you by your health care provider. Make sure you discuss any questions you have with your health care provider. Document Revised: 09/27/2020 Document Reviewed: 09/27/2020 Elsevier Patient Education  2023 [employer]  
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
## 2022-08-30 ENCOUNTER — Encounter: Payer: Self-pay | Admitting: Family Medicine

## 2022-08-30 ENCOUNTER — Ambulatory Visit (INDEPENDENT_AMBULATORY_CARE_PROVIDER_SITE_OTHER): Payer: BC Managed Care – PPO | Admitting: Family Medicine

## 2022-08-30 VITALS — BP 134/82 | HR 82 | Resp 16 | Ht 62.0 in | Wt 195.0 lb

## 2022-08-30 DIAGNOSIS — Z131 Encounter for screening for diabetes mellitus: Secondary | ICD-10-CM | POA: Diagnosis not present

## 2022-08-30 DIAGNOSIS — Z79899 Other long term (current) drug therapy: Secondary | ICD-10-CM

## 2022-08-30 DIAGNOSIS — Z1211 Encounter for screening for malignant neoplasm of colon: Secondary | ICD-10-CM

## 2022-08-30 DIAGNOSIS — Z0000 Encounter for general adult medical examination without abnormal findings: Secondary | ICD-10-CM | POA: Diagnosis not present

## 2022-08-30 DIAGNOSIS — E7800 Pure hypercholesterolemia, unspecified: Secondary | ICD-10-CM

## 2022-08-30 DIAGNOSIS — E559 Vitamin D deficiency, unspecified: Secondary | ICD-10-CM

## 2022-08-30 DIAGNOSIS — Z1231 Encounter for screening mammogram for malignant neoplasm of breast: Secondary | ICD-10-CM

## 2022-08-30 LAB — CBC WITH DIFFERENTIAL/PLATELET
Basophils Absolute: 28 cells/uL (ref 0–200)
HCT: 35.7 % (ref 35.0–45.0)
MCHC: 34.2 g/dL (ref 32.0–36.0)
MCV: 94.4 fL (ref 80.0–100.0)

## 2022-08-31 LAB — VITAMIN D 25 HYDROXY (VIT D DEFICIENCY, FRACTURES): Vit D, 25-Hydroxy: 25 ng/mL — ABNORMAL LOW (ref 30–100)

## 2022-08-31 LAB — CBC WITH DIFFERENTIAL/PLATELET
Absolute Monocytes: 448 cells/uL (ref 200–950)
Basophils Relative: 0.4 %
Eosinophils Absolute: 140 cells/uL (ref 15–500)
Eosinophils Relative: 2 %
Hemoglobin: 12.2 g/dL (ref 11.7–15.5)
Lymphs Abs: 1988 cells/uL (ref 850–3900)
MCH: 32.3 pg (ref 27.0–33.0)
MPV: 9.7 fL (ref 7.5–12.5)
Monocytes Relative: 6.4 %
Neutro Abs: 4396 cells/uL (ref 1500–7800)
Neutrophils Relative %: 62.8 %
Platelets: 200 10*3/uL (ref 140–400)
RBC: 3.78 10*6/uL — ABNORMAL LOW (ref 3.80–5.10)
RDW: 13.6 % (ref 11.0–15.0)
Total Lymphocyte: 28.4 %
WBC: 7 10*3/uL (ref 3.8–10.8)

## 2022-08-31 LAB — COMPLETE METABOLIC PANEL WITH GFR
AG Ratio: 1.2 (calc) (ref 1.0–2.5)
ALT: 10 U/L (ref 6–29)
AST: 14 U/L (ref 10–35)
Albumin: 3.9 g/dL (ref 3.6–5.1)
Alkaline phosphatase (APISO): 82 U/L (ref 37–153)
BUN: 18 mg/dL (ref 7–25)
CO2: 25 mmol/L (ref 20–32)
Calcium: 8.8 mg/dL (ref 8.6–10.4)
Chloride: 103 mmol/L (ref 98–110)
Creat: 0.8 mg/dL (ref 0.50–1.03)
Globulin: 3.2 g/dL (calc) (ref 1.9–3.7)
Glucose, Bld: 93 mg/dL (ref 65–99)
Potassium: 4 mmol/L (ref 3.5–5.3)
Sodium: 139 mmol/L (ref 135–146)
Total Bilirubin: 0.5 mg/dL (ref 0.2–1.2)
Total Protein: 7.1 g/dL (ref 6.1–8.1)
eGFR: 85 mL/min/{1.73_m2} (ref >=60)

## 2022-08-31 LAB — HEMOGLOBIN A1C
Hgb A1c MFr Bld: 5.9 %{Hb} — ABNORMAL HIGH
Mean Plasma Glucose: 123 mg/dL
eAG (mmol/L): 6.8 mmol/L

## 2022-08-31 LAB — LIPID PANEL
Cholesterol: 230 mg/dL — ABNORMAL HIGH (ref <200)
HDL: 64 mg/dL (ref >=50)
LDL Cholesterol (Calc): 136 mg/dL (calc) — ABNORMAL HIGH
Non-HDL Cholesterol (Calc): 166 mg/dL (calc) — ABNORMAL HIGH (ref <130)
Total CHOL/HDL Ratio: 3.6 (calc) (ref <5.0)
Triglycerides: 161 mg/dL — ABNORMAL HIGH (ref <150)

## 2022-09-06 ENCOUNTER — Ambulatory Visit: Payer: BC Managed Care – PPO | Admitting: Family Medicine

## 2022-09-11 ENCOUNTER — Ambulatory Visit (INDEPENDENT_AMBULATORY_CARE_PROVIDER_SITE_OTHER): Payer: BC Managed Care – PPO | Admitting: Nurse Practitioner

## 2022-09-11 ENCOUNTER — Encounter: Payer: Self-pay | Admitting: Nurse Practitioner

## 2022-09-11 ENCOUNTER — Other Ambulatory Visit: Payer: Self-pay

## 2022-09-11 VITALS — BP 122/76 | HR 89 | Temp 98.2°F | Resp 16 | Ht 62.0 in | Wt 195.7 lb

## 2022-09-11 DIAGNOSIS — I10 Essential (primary) hypertension: Secondary | ICD-10-CM

## 2022-09-11 DIAGNOSIS — F330 Major depressive disorder, recurrent, mild: Secondary | ICD-10-CM | POA: Diagnosis not present

## 2022-09-11 DIAGNOSIS — F411 Generalized anxiety disorder: Secondary | ICD-10-CM

## 2022-09-11 DIAGNOSIS — E7800 Pure hypercholesterolemia, unspecified: Secondary | ICD-10-CM

## 2022-09-11 DIAGNOSIS — R7303 Prediabetes: Secondary | ICD-10-CM | POA: Insufficient documentation

## 2022-09-11 DIAGNOSIS — K219 Gastro-esophageal reflux disease without esophagitis: Secondary | ICD-10-CM

## 2022-09-11 MED ORDER — ESCITALOPRAM OXALATE 10 MG PO TABS: 10.0000 mg | ORAL_TABLET | Freq: Every day | ORAL | 1 refills | Status: DC

## 2022-09-11 MED ORDER — PANTOPRAZOLE SODIUM 20 MG PO TBEC: 20.0000 mg | DELAYED_RELEASE_TABLET | Freq: Every day | ORAL | 1 refills | Status: DC

## 2022-09-11 MED ORDER — ALPRAZOLAM 0.25 MG PO TABS
0.2500 mg | ORAL_TABLET | Freq: Every day | ORAL | 0 refills | Status: DC | PRN
Start: 2022-09-11 — End: 2023-03-07

## 2022-09-11 MED ORDER — ATENOLOL 50 MG PO TABS: 50.0000 mg | ORAL_TABLET | Freq: Every day | ORAL | 1 refills | Status: DC

## 2022-09-11 MED ORDER — ATORVASTATIN CALCIUM 10 MG PO TABS
10.0000 mg | ORAL_TABLET | Freq: Every day | ORAL | 1 refills | Status: DC
Start: 2022-09-11 — End: 2023-03-07

## 2022-09-11 NOTE — Assessment & Plan Note (Signed)
Continue atenolol 50 mg daily. 

## 2022-09-11 NOTE — Assessment & Plan Note (Signed)
Continue taking pantoprozole 20 mg daily.

## 2022-09-11 NOTE — Assessment & Plan Note (Signed)
She is working on life style modification.  

## 2022-09-11 NOTE — Assessment & Plan Note (Signed)
Continue working on lifestyle modification.  

## 2022-09-11 NOTE — Assessment & Plan Note (Signed)
Doing well. Continue taking lexapro 10 mg daily.  And xanax as needed.

## 2022-09-11 NOTE — Assessment & Plan Note (Signed)
Start atorvastatin 10 mg daily.

## 2022-09-11 NOTE — Progress Notes (Signed)
BP 122/76   Pulse 89   Temp 98.2 F (36.8 C) (Oral)   Resp 16   Ht 5\' 2"  (1.575 m)   Wt 195 lb 11.2 oz (88.8 kg)   SpO2 98%   BMI 35.79 kg/m    Subjective:    Patient ID: Suzanne Griffin, female    DOB: 31-Mar-1966, 57 y.o.   MRN: [id number]  HPI: Suzanne Griffin is a 57 y.o. female  Chief Complaint  Patient presents with   Follow-up    1 week recheck   Hypertension: her blood pressure today is 122/76. She currently takes atenolol 50 mg daily. She denies any chest pain, shortness of breath, headaches or blurred vision.   Gerd: currently takes pantoprazole 20 mg daily. She reports she is doing well.   Hyperlipidemia: her last LDL was 136 on 08/30/2022. She is not currently on medication. Discussed starting cholesterol medication per Dr. Carlynn Purl recommendation regarding her labs.  Patient is willing to try.  Will start atorvastatin 10 mg daily.  The 10-year ASCVD risk score (Arnett DK, et al., 2019) is: 3.3%   Values used to calculate the score:     Age: 61 years     Sex: Female     Is Non-Hispanic African American: No     Diabetic: No     Tobacco smoker: No     Systolic Blood Pressure: 122 mmHg     Is BP treated: Yes     HDL Cholesterol: 64 mg/dL     Total Cholesterol: 230 mg/dL   Prediabetes: her last A1C was 5.9 on 08/30/2022. She denies any polyuria, polydipsia or polyphagia.  She has been cutting back on carbohydrates like pasta and rice.    Obesity: her weight today is 195  lbs with a BMI of 35.79. Recommend working on lifestyle modification that includes eating well balanced diet with portion control and increasing physical activity as tolerated.   Depression/anxiety: Her PHQ9 and GAD scores are negative.  She is currently taking lexapro 10 mg daily. She says that she will take a xanax every now and then.     09/11/2022    3:48 PM 09/11/2022    3:40 PM 09/11/2022    3:32 PM 08/30/2022    7:40 AM 03/01/2022    7:44 AM  Depression screen PHQ 2/9  Decreased  Interest 0 0 0 1 0  Down, Depressed, Hopeless 0 0 0 1 0  PHQ - 2 Score 0 0 0 2 0  Altered sleeping 1   0 0  Tired, decreased energy 1   0 0  Change in appetite 0   0 0  Feeling bad or failure about yourself  0   0 0  Trouble concentrating 0   0 0  Moving slowly or fidgety/restless 0   0 0  Suicidal thoughts 0   0 0  PHQ-9 Score 2   2 0  Difficult doing work/chores Not difficult at all           09/11/2022    3:48 PM 05/05/2018    2:23 PM 12/24/2017    9:25 AM  GAD 7 : Generalized Anxiety Score  Nervous, Anxious, on Edge 0 1 3  Control/stop worrying 0 0 3  Worry too much - different things 0 1 3  Trouble relaxing 0 0 2  Restless 0 0 1  Easily annoyed or irritable 0 0 2  Afraid - awful might happen 0 0 1  Total GAD  7 Score 0 2 15  Anxiety Difficulty Not difficult at all  Very difficult      Relevant past medical, surgical, family and social history reviewed and updated as indicated. Interim medical history since our last visit reviewed. Allergies and medications reviewed and updated.  Review of Systems  Constitutional: Negative for fever or weight change.  Respiratory: Negative for cough and shortness of breath.   Cardiovascular: Negative for chest pain or palpitations.  Gastrointestinal: Negative for abdominal pain, no bowel changes.  Musculoskeletal: Negative for gait problem or joint swelling.  Skin: Negative for rash.  Neurological: Negative for dizziness or headache.  No other specific complaints in a complete review of systems (except as listed in HPI above).      Objective:    BP 122/76   Pulse 89   Temp 98.2 F (36.8 C) (Oral)   Resp 16   Ht 5\' 2"  (1.575 m)   Wt 195 lb 11.2 oz (88.8 kg)   SpO2 98%   BMI 35.79 kg/m   Wt Readings from Last 3 Encounters:  09/11/22 195 lb 11.2 oz (88.8 kg)  08/30/22 195 lb (88.5 kg)  03/01/22 197 lb (89.4 kg)    Physical Exam  Constitutional: Patient appears well-developed and well-nourished. Obese  No distress.   HEENT: head atraumatic, normocephalic, pupils equal and reactive to light, neck supple, throat within normal limits Cardiovascular: Normal rate, regular rhythm and normal heart sounds.  No murmur heard. No BLE edema. Pulmonary/Chest: Effort normal and breath sounds normal. No respiratory distress. Abdominal: Soft.  There is no tenderness. Psychiatric: Patient has a normal mood and affect. behavior is normal. Judgment and thought content normal.  Results for orders placed or performed in visit on 08/30/22  HM COLONOSCOPY  Result Value Ref Range   HM Colonoscopy See Report (in chart) See Report (in chart), Patient Reported      Assessment & Plan:   Problem List Items Addressed This Visit       Cardiovascular and Mediastinum   Hypertension - Primary    Continue atenolol 50 mg daily.       Relevant Medications   atenolol (TENORMIN) 50 MG tablet   atorvastatin (LIPITOR) 10 MG tablet     Digestive   Gastro-esophageal reflux disease without esophagitis    Continue taking pantoprozole 20 mg daily.       Relevant Medications   pantoprazole (PROTONIX) 20 MG tablet     Other   Hyperlipidemia    Start atorvastatin 10 mg daily       Relevant Medications   atenolol (TENORMIN) 50 MG tablet   atorvastatin (LIPITOR) 10 MG tablet   Morbid obesity (HCC)    Continue working on lifestyle modification.       Mild recurrent major depression (HCC)    Doing well. Continue taking lexapro 10 mg daily.  And xanax as needed.       Relevant Medications   ALPRAZolam (XANAX) 0.25 MG tablet   escitalopram (LEXAPRO) 10 MG tablet   GAD (generalized anxiety disorder)    Doing well. Continue taking lexapro 10 mg daily.  And xanax as needed.       Relevant Medications   atenolol (TENORMIN) 50 MG tablet   ALPRAZolam (XANAX) 0.25 MG tablet   escitalopram (LEXAPRO) 10 MG tablet   Prediabetes    She is working on lifestyle modification      Other Visit Diagnoses     Essential hypertension  Relevant Medications   atenolol (TENORMIN) 50 MG tablet   atorvastatin (LIPITOR) 10 MG tablet        Follow up plan: Return in about 6 months (around 03/14/2023) for follow up.

## 2022-09-11 NOTE — Assessment & Plan Note (Signed)
Doing well. Continue taking lexapro 10 mg daily.  And xanax as needed.  
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
## 2023-03-07 ENCOUNTER — Encounter: Payer: Self-pay | Admitting: Family Medicine

## 2023-03-07 ENCOUNTER — Ambulatory Visit (INDEPENDENT_AMBULATORY_CARE_PROVIDER_SITE_OTHER): Payer: BC Managed Care – PPO | Admitting: Family Medicine

## 2023-03-07 VITALS — BP 130/72 | HR 79 | Resp 16 | Ht 62.0 in | Wt 192.0 lb

## 2023-03-07 DIAGNOSIS — F411 Generalized anxiety disorder: Secondary | ICD-10-CM

## 2023-03-07 DIAGNOSIS — E7800 Pure hypercholesterolemia, unspecified: Secondary | ICD-10-CM

## 2023-03-07 DIAGNOSIS — F330 Major depressive disorder, recurrent, mild: Secondary | ICD-10-CM

## 2023-03-07 DIAGNOSIS — R7303 Prediabetes: Secondary | ICD-10-CM

## 2023-03-07 DIAGNOSIS — K219 Gastro-esophageal reflux disease without esophagitis: Secondary | ICD-10-CM

## 2023-03-07 DIAGNOSIS — I10 Essential (primary) hypertension: Secondary | ICD-10-CM | POA: Diagnosis not present

## 2023-03-07 DIAGNOSIS — Z23 Encounter for immunization: Secondary | ICD-10-CM

## 2023-03-07 MED ORDER — ATENOLOL 50 MG PO TABS: 50.0000 mg | ORAL_TABLET | Freq: Every day | ORAL | 1 refills | Status: DC

## 2023-03-07 MED ORDER — ALPRAZOLAM 0.25 MG PO TABS
0.2500 mg | ORAL_TABLET | Freq: Every day | ORAL | 0 refills | Status: DC | PRN
Start: 2023-03-07 — End: 2023-09-04

## 2023-03-07 MED ORDER — ATORVASTATIN CALCIUM 10 MG PO TABS: 10.0000 mg | ORAL_TABLET | Freq: Every day | ORAL | 1 refills | Status: DC

## 2023-03-07 MED ORDER — ESCITALOPRAM OXALATE 10 MG PO TABS: 10.0000 mg | ORAL_TABLET | Freq: Every day | ORAL | 1 refills | Status: DC

## 2023-03-07 MED ORDER — PANTOPRAZOLE SODIUM 20 MG PO TBEC: 20.0000 mg | DELAYED_RELEASE_TABLET | Freq: Every day | ORAL | 1 refills | Status: DC

## 2023-03-08 LAB — COMPLETE METABOLIC PANEL WITH GFR
AG Ratio: 1.3 (calc) (ref 1.0–2.5)
ALT: 11 U/L (ref 6–29)
AST: 13 U/L (ref 10–35)
Albumin: 4.1 g/dL (ref 3.6–5.1)
Alkaline phosphatase (APISO): 109 U/L (ref 37–153)
BUN: 18 mg/dL (ref 7–25)
CO2: 28 mmol/L (ref 20–32)
Calcium: 9.1 mg/dL (ref 8.6–10.4)
Chloride: 102 mmol/L (ref 98–110)
Creat: 0.69 mg/dL (ref 0.50–1.03)
Globulin: 3.1 g/dL (ref 1.9–3.7)
Glucose, Bld: 94 mg/dL (ref 65–99)
Potassium: 4.2 mmol/L (ref 3.5–5.3)
Sodium: 138 mmol/L (ref 135–146)
Total Bilirubin: 0.6 mg/dL (ref 0.2–1.2)
Total Protein: 7.2 g/dL (ref 6.1–8.1)
eGFR: 101 mL/min/{1.73_m2} (ref >=60)

## 2023-03-08 LAB — LIPID PANEL
Cholesterol: 167 mg/dL (ref <200)
HDL: 56 mg/dL (ref >=50)
LDL Cholesterol (Calc): 91 mg/dL
Non-HDL Cholesterol (Calc): 111 mg/dL (ref <130)
Total CHOL/HDL Ratio: 3 (calc) (ref <5.0)
Triglycerides: 108 mg/dL (ref <150)

## 2023-03-08 LAB — HEMOGLOBIN A1C
Hgb A1c MFr Bld: 5.8 %{Hb} — ABNORMAL HIGH (ref <5.7)
Mean Plasma Glucose: 120 mg/dL
eAG (mmol/L): 6.6 mmol/L

## 2023-08-25 DIAGNOSIS — J9801 Acute bronchospasm: Secondary | ICD-10-CM | POA: Diagnosis not present

## 2023-08-25 DIAGNOSIS — R051 Acute cough: Secondary | ICD-10-CM | POA: Diagnosis not present

## 2023-08-25 DIAGNOSIS — Z03818 Encounter for observation for suspected exposure to other biological agents ruled out: Secondary | ICD-10-CM | POA: Diagnosis not present

## 2023-08-25 DIAGNOSIS — J029 Acute pharyngitis, unspecified: Secondary | ICD-10-CM | POA: Diagnosis not present

## 2023-08-25 DIAGNOSIS — R509 Fever, unspecified: Secondary | ICD-10-CM | POA: Diagnosis not present

## 2023-08-26 ENCOUNTER — Ambulatory Visit: Payer: Self-pay | Admitting: Family Medicine

## 2023-08-28 DIAGNOSIS — B338 Other specified viral diseases: Secondary | ICD-10-CM | POA: Diagnosis not present

## 2023-09-04 ENCOUNTER — Ambulatory Visit (INDEPENDENT_AMBULATORY_CARE_PROVIDER_SITE_OTHER): Payer: Self-pay | Admitting: Family Medicine

## 2023-09-04 ENCOUNTER — Encounter: Payer: Self-pay | Admitting: Family Medicine

## 2023-09-04 VITALS — BP 124/70 | HR 63 | Resp 18 | Ht 62.0 in | Wt 194.3 lb

## 2023-09-04 DIAGNOSIS — F325 Major depressive disorder, single episode, in full remission: Secondary | ICD-10-CM

## 2023-09-04 DIAGNOSIS — K219 Gastro-esophageal reflux disease without esophagitis: Secondary | ICD-10-CM

## 2023-09-04 DIAGNOSIS — Z1231 Encounter for screening mammogram for malignant neoplasm of breast: Secondary | ICD-10-CM

## 2023-09-04 DIAGNOSIS — R7303 Prediabetes: Secondary | ICD-10-CM

## 2023-09-04 DIAGNOSIS — E7800 Pure hypercholesterolemia, unspecified: Secondary | ICD-10-CM

## 2023-09-04 DIAGNOSIS — I10 Essential (primary) hypertension: Secondary | ICD-10-CM | POA: Diagnosis not present

## 2023-09-04 DIAGNOSIS — R058 Other specified cough: Secondary | ICD-10-CM

## 2023-09-04 DIAGNOSIS — F411 Generalized anxiety disorder: Secondary | ICD-10-CM

## 2023-09-04 MED ORDER — ALPRAZOLAM 0.25 MG PO TABS: 0.2500 mg | ORAL_TABLET | Freq: Every day | ORAL | 0 refills | Status: AC | PRN

## 2023-09-04 MED ORDER — ATENOLOL 50 MG PO TABS: 50.0000 mg | ORAL_TABLET | Freq: Every day | ORAL | 1 refills | Status: DC

## 2023-09-04 MED ORDER — ATORVASTATIN CALCIUM 10 MG PO TABS: 10.0000 mg | ORAL_TABLET | Freq: Every day | ORAL | 1 refills | Status: DC

## 2023-09-04 MED ORDER — BENZONATATE 100 MG PO CAPS: 100.0000 mg | ORAL_CAPSULE | Freq: Three times a day (TID) | ORAL | 0 refills | Status: DC | PRN

## 2023-09-04 MED ORDER — PANTOPRAZOLE SODIUM 20 MG PO TBEC: 20.0000 mg | DELAYED_RELEASE_TABLET | Freq: Every day | ORAL | 1 refills | Status: DC

## 2023-09-04 MED ORDER — ESCITALOPRAM OXALATE 10 MG PO TABS: 10.0000 mg | ORAL_TABLET | Freq: Every day | ORAL | 1 refills | Status: DC
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
## 2023-09-05 ENCOUNTER — Encounter: Payer: BC Managed Care – PPO | Admitting: Family Medicine

## 2023-09-10 DIAGNOSIS — J9801 Acute bronchospasm: Secondary | ICD-10-CM | POA: Diagnosis not present

## 2023-09-10 DIAGNOSIS — R051 Acute cough: Secondary | ICD-10-CM | POA: Diagnosis not present

## 2023-09-15 ENCOUNTER — Other Ambulatory Visit: Payer: Self-pay | Admitting: Pediatrics

## 2023-09-15 DIAGNOSIS — J22 Unspecified acute lower respiratory infection: Secondary | ICD-10-CM

## 2023-09-15 DIAGNOSIS — R9389 Abnormal findings on diagnostic imaging of other specified body structures: Secondary | ICD-10-CM

## 2023-09-15 DIAGNOSIS — R053 Chronic cough: Secondary | ICD-10-CM

## 2023-09-15 DIAGNOSIS — R911 Solitary pulmonary nodule: Secondary | ICD-10-CM

## 2023-09-19 ENCOUNTER — Encounter: Payer: Self-pay | Admitting: Family Medicine

## 2023-09-19 ENCOUNTER — Ambulatory Visit (INDEPENDENT_AMBULATORY_CARE_PROVIDER_SITE_OTHER): Payer: Self-pay | Admitting: Family Medicine

## 2023-09-19 VITALS — BP 122/80 | HR 75 | Resp 16 | Ht 60.75 in | Wt 193.3 lb

## 2023-09-19 DIAGNOSIS — Z13820 Encounter for screening for osteoporosis: Secondary | ICD-10-CM

## 2023-09-19 DIAGNOSIS — Z111 Encounter for screening for respiratory tuberculosis: Secondary | ICD-10-CM | POA: Diagnosis not present

## 2023-09-19 DIAGNOSIS — Z780 Asymptomatic menopausal state: Secondary | ICD-10-CM | POA: Diagnosis not present

## 2023-09-19 DIAGNOSIS — Z1231 Encounter for screening mammogram for malignant neoplasm of breast: Secondary | ICD-10-CM

## 2023-09-19 DIAGNOSIS — Z0000 Encounter for general adult medical examination without abnormal findings: Secondary | ICD-10-CM
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
## 2023-09-22 ENCOUNTER — Ambulatory Visit
Admission: RE | Admit: 2023-09-22 | Discharge: 2023-09-22 | Disposition: A | Discharge status: Home / Self Care | Source: Ambulatory Visit | Attending: Pediatrics | Admitting: Pediatrics

## 2023-09-22 DIAGNOSIS — R9389 Abnormal findings on diagnostic imaging of other specified body structures: Secondary | ICD-10-CM | POA: Insufficient documentation

## 2023-09-22 DIAGNOSIS — J8410 Pulmonary fibrosis, unspecified: Secondary | ICD-10-CM | POA: Diagnosis not present

## 2023-09-22 DIAGNOSIS — R911 Solitary pulmonary nodule: Secondary | ICD-10-CM | POA: Insufficient documentation

## 2023-09-22 DIAGNOSIS — J22 Unspecified acute lower respiratory infection: Secondary | ICD-10-CM | POA: Insufficient documentation

## 2023-09-22 DIAGNOSIS — R053 Chronic cough: Secondary | ICD-10-CM | POA: Diagnosis not present

## 2023-09-22 MED ORDER — IOHEXOL 300 MG/ML  SOLN
75.0000 mL | Freq: Once | INTRAMUSCULAR | Status: AC | PRN
  Administered 2023-09-22: 75 mL via INTRAVENOUS

## 2023-09-24 ENCOUNTER — Ambulatory Visit: Payer: Self-pay | Admitting: Family Medicine

## 2023-09-24 DIAGNOSIS — J849 Interstitial pulmonary disease, unspecified: Secondary | ICD-10-CM | POA: Diagnosis not present

## 2023-09-24 DIAGNOSIS — J84112 Idiopathic pulmonary fibrosis: Secondary | ICD-10-CM | POA: Diagnosis not present

## 2023-09-24 DIAGNOSIS — R0602 Shortness of breath: Secondary | ICD-10-CM | POA: Diagnosis not present

## 2023-09-24 DIAGNOSIS — R059 Cough, unspecified: Secondary | ICD-10-CM | POA: Diagnosis not present

## 2023-09-24 LAB — QUANTIFERON-TB GOLD PLUS
Mitogen-NIL: 3.5 [IU]/mL
NIL: 0.03 [IU]/mL
QuantiFERON-TB Gold Plus: POSITIVE — AB
TB1-NIL: 0.73 [IU]/mL
TB2-NIL: 0.78 [IU]/mL

## 2023-09-30 ENCOUNTER — Telehealth: Payer: Self-pay

## 2023-09-30 ENCOUNTER — Ambulatory Visit (LOCAL_COMMUNITY_HEALTH_CENTER): Payer: Self-pay

## 2023-09-30 VITALS — Wt 191.0 lb

## 2023-09-30 DIAGNOSIS — R9389 Abnormal findings on diagnostic imaging of other specified body structures: Secondary | ICD-10-CM

## 2023-09-30 DIAGNOSIS — R7612 Nonspecific reaction to cell mediated immunity measurement of gamma interferon antigen response without active tuberculosis: Secondary | ICD-10-CM

## 2023-09-30 NOTE — Telephone Encounter (Signed)
 Referral from [facility] clinic internal medicine received.   Abnormal Chest X-Ray and abnormal chest CT-scan 09/22/2023.  09/24/2023 Quantiferon TB test positive. Patient symptomatic with persistent cough.   TB screening evaluation by [facility] department recommended, as well as sputum collection.   Phone call to patient, voice message left asking patient to contact [place] TB clinic nurse at [phone]. Michele Ahle, RN

## 2023-09-30 NOTE — Telephone Encounter (Addendum)
 Phone call from patient. EPI flowsheet completed.  Sputum collection scheduled to be done this PM. Home visit scheduled 09/30/2023.

## 2023-10-01 ENCOUNTER — Ambulatory Visit: Payer: Self-pay

## 2023-10-01 ENCOUNTER — Other Ambulatory Visit: Payer: Self-pay

## 2023-10-01 ENCOUNTER — Other Ambulatory Visit: Payer: Self-pay | Admitting: Surgery

## 2023-10-01 DIAGNOSIS — R9389 Abnormal findings on diagnostic imaging of other specified body structures: Secondary | ICD-10-CM

## 2023-10-01 DIAGNOSIS — R7612 Nonspecific reaction to cell mediated immunity measurement of gamma interferon antigen response without active tuberculosis: Secondary | ICD-10-CM | POA: Insufficient documentation

## 2023-10-01 NOTE — Progress Notes (Signed)
 Patient referred by [facility] clinic internal medicine for TB screening and evaluation to r/o active TB disease. Patients record reviewed by Dr. Alyn Babe.   Home visit 09/30/2023:  EPI flowsheet completed.       Patient denies: fever, night sweats,        cough, chest pain, SOB, weight loss       or loss of appetite.        Pt. State all symptoms reported        In previous visits have resolved.   Information about TB latent v/s active disease reviewed with patient and her husband.  Informed about importance of sputum collection as part of tuberculosis screening testing to r/o active TB lung disease.  Sputum specimen collection x 3 scheduled as follows:  Induce sputum for initial collection offered. Patient elected to collect an early morning specimen 10/01/2023. Second specimen collection scheduled 10/11/2023. 3rd sputum collection scheduled 10/12/2023 6:00 am.  Advised to wear a mask if needs to visit a medical provider's office or other public areas, specially if develops new symptoms.   Return home visit scheduled 10/01/2023 at 4:00 PM.  Michele Ahle, RN

## 2023-10-02 ENCOUNTER — Other Ambulatory Visit: Payer: Self-pay | Admitting: Surgery

## 2023-10-02 DIAGNOSIS — R0602 Shortness of breath: Secondary | ICD-10-CM | POA: Diagnosis not present

## 2023-10-02 DIAGNOSIS — J849 Interstitial pulmonary disease, unspecified: Secondary | ICD-10-CM | POA: Diagnosis not present

## 2023-10-02 DIAGNOSIS — R059 Cough, unspecified: Secondary | ICD-10-CM | POA: Diagnosis not present

## 2023-10-02 DIAGNOSIS — R9389 Abnormal findings on diagnostic imaging of other specified body structures: Secondary | ICD-10-CM

## 2023-10-06 LAB — MYCOBACTERIOLOGY (TB) ~~LOC~~ LAB
AFB Smear: NONE SEEN
MYCOBACTERIA CULTURE: NO GROWTH

## 2023-10-07 ENCOUNTER — Telehealth: Payer: Self-pay

## 2023-10-07 NOTE — Telephone Encounter (Signed)
 Sputum smear AFB negative x 3, culture final reports pending. Phone call to [facility], sputum smear results given to [place]/ [phone]. Almarie Metro, RN

## 2023-10-14 DIAGNOSIS — R0602 Shortness of breath: Secondary | ICD-10-CM | POA: Insufficient documentation

## 2023-10-15 DIAGNOSIS — J849 Interstitial pulmonary disease, unspecified: Secondary | ICD-10-CM | POA: Diagnosis not present

## 2023-10-21 LAB — MYCOBACTERIOLOGY (TB) ~~LOC~~ LAB

## 2023-10-24 ENCOUNTER — Other Ambulatory Visit: Payer: Self-pay | Admitting: Surgery

## 2023-10-24 NOTE — Addendum Note (Signed)
 Addended by: HERLENE DELON HERO on: 10/24/2023 03:33 PM   Modules accepted: Orders

## 2023-10-24 NOTE — Addendum Note (Signed)
 Addended by: HERLENE DELON HERO on: 10/24/2023 03:42 PM   Modules accepted: Orders, Level of Service

## 2023-10-24 NOTE — Progress Notes (Signed)
 Erroneous encounter, please delete, no orders were placed.This encounter was created in error - please disregard.

## 2023-10-24 NOTE — Addendum Note (Signed)
 Addended by: HERLENE DELON HERO on: 10/24/2023 03:30 PM   Modules accepted: Orders

## 2023-11-17 LAB — MYCOBACTERIOLOGY (TB) ~~LOC~~ LAB
AFB Smear: NONE SEEN
MYCOBACTERIA CULTURE: NO GROWTH

## 2023-11-18 ENCOUNTER — Ambulatory Visit: Payer: Self-pay | Admitting: Surgery

## 2023-11-19 ENCOUNTER — Encounter: Payer: Self-pay | Admitting: Surgery

## 2023-11-19 ENCOUNTER — Telehealth: Payer: Self-pay

## 2023-11-19 ENCOUNTER — Other Ambulatory Visit: Payer: Self-pay | Admitting: Surgery

## 2023-11-19 DIAGNOSIS — Z227 Latent tuberculosis: Secondary | ICD-10-CM | POA: Insufficient documentation

## 2023-11-19 NOTE — Telephone Encounter (Signed)
 Latent tuberculosis infection treatment recommended. Phone call to patient voice message left asking patient to contact TB clinic nurse at [phone]. Almarie Metro, RN

## 2023-11-19 NOTE — Progress Notes (Signed)
 PATIENT REFERRAL FOR EVAL AND TX OF LTBI:  CXR 09/10/2023: Some abnormalities including persistent scattered reticular opacities, and possible focal right upper lobe bronchiectasis  possible cavitation versus scarring in the right midlung; Ill-defined left midlung nodule. CT chest 09/22/2023 shows no acute findings, no lung mass, consolidation, pleural effusion or pneumothorax. No suspicious lung nodule. QFT+:  09/19/2023 EPI: 09/30/2023  Patient underwent sputum collection x3, all were AFB smear negative, two had no growth, one grew a species of mycobacteria that was not MTB.  Per EPIC patient is not on any medications nor has any allergies that would preclude them from taking rifampin/rifapentine or INH for treatment of LTBI.  However, the patient is on medication that might be affected by LTBI treatment.  LTBI tx options:  1.) INH 900 mg/rifapentine 900 mg weekly for 12 doses 2.) Rifampin 600 mg daily x 4 months  Baseline labs: Obtain CBC and LFTs at start visit  Monthly labs: Obtain monthly LFTs. Only needs CBC if concerning symptoms arise such as fatigue, rash, easy bruising, bleeding or fevers.   -------------------------------------  If no recent HIV (within at least the last 6 months and no concerns for recent exposure), offer HIV and syphilis at start visit, if declines HIV patient must sign waiver.   NOTE TO NURSING:  POTENTIAL DRUG INTERACTIONS: Rifampin can decrease the effectiveness of the patient's:  Xanax  Lexapro  Protonix   All patients react differently to rifampin, and it is possible that the patient will not notice any side effects or decreased effectiveness of these medications.   However, if the patient notices any of the following, they should contact us  and potentially schedule follow up with their PCP for medication adjustments while taking rifampin:   Decreased effectiveness of PPI/___Protonix___: -Worsening symptoms of GERD, heartburn, abdominal pain, burping,  bad taste in mouth  Decreased effectiveness of depression/anxietymedication__Xanax/Lexapro__: -Persistent feelings of sadness, loss of interest, tearfulness, trouble sleeping, increased anxiety, SI/HI  Patient on statin: -Patient needs monthly LFTs due to potential interaction between Lipitor and rifampin, causing increased liver enzymes/liver toxicity.  MD Attestation for TB RN: I agree with the care provided to this patient and the plan for follow up and treatment.  If patient declines LTBI tx, please educate patient as to the signs and symptoms of active TB and to contact the health department with any additional concerns or need for documentation.   Suzanne Griffin. Herlene, MD

## 2023-11-24 ENCOUNTER — Telehealth: Payer: Self-pay

## 2023-11-24 NOTE — Progress Notes (Signed)
 11/18/2023 Medication list reviewed with patient over the phone, states she does not remember all medications prescribed, unable to provide list at that time. Home visit to compare med listed in EPIC, info updated.   LTBI treatment options reviewed with patient, she prefers to think about the possibility of treatment before taking TB medications.  Encouraged to follow-up as recommended to prevent active TB disease. Patient states understanding, she will contact TB clinic nurse in future if agrees to initiate treatment.  Note assigned to Dr. Herlene. Almarie Metro, RN

## 2023-11-24 NOTE — Telephone Encounter (Signed)
 Attempted to reach pt to discuss LTBI tx.  Delon LITTIE Primrose, RN

## 2023-12-15 ENCOUNTER — Other Ambulatory Visit: Payer: Self-pay | Admitting: Family Medicine

## 2023-12-15 DIAGNOSIS — F411 Generalized anxiety disorder: Secondary | ICD-10-CM

## 2023-12-15 DIAGNOSIS — F325 Major depressive disorder, single episode, in full remission: Secondary | ICD-10-CM

## 2024-03-08 ENCOUNTER — Ambulatory Visit: Admitting: Family Medicine

## 2024-03-08 ENCOUNTER — Encounter: Payer: Self-pay | Admitting: Family Medicine

## 2024-03-08 VITALS — BP 122/76 | HR 73 | Resp 16 | Ht 60.75 in | Wt 193.6 lb

## 2024-03-08 DIAGNOSIS — K219 Gastro-esophageal reflux disease without esophagitis: Secondary | ICD-10-CM

## 2024-03-08 DIAGNOSIS — R7303 Prediabetes: Secondary | ICD-10-CM | POA: Diagnosis not present

## 2024-03-08 DIAGNOSIS — I10 Essential (primary) hypertension: Secondary | ICD-10-CM | POA: Diagnosis not present

## 2024-03-08 DIAGNOSIS — E7800 Pure hypercholesterolemia, unspecified: Secondary | ICD-10-CM

## 2024-03-08 DIAGNOSIS — Z1159 Encounter for screening for other viral diseases: Secondary | ICD-10-CM

## 2024-03-08 DIAGNOSIS — F325 Major depressive disorder, single episode, in full remission: Secondary | ICD-10-CM

## 2024-03-08 DIAGNOSIS — F411 Generalized anxiety disorder: Secondary | ICD-10-CM

## 2024-03-08 DIAGNOSIS — J8410 Pulmonary fibrosis, unspecified: Secondary | ICD-10-CM

## 2024-03-08 DIAGNOSIS — E559 Vitamin D deficiency, unspecified: Secondary | ICD-10-CM

## 2024-03-08 DIAGNOSIS — Z23 Encounter for immunization: Secondary | ICD-10-CM | POA: Diagnosis not present

## 2024-03-08 MED ORDER — ATORVASTATIN CALCIUM 10 MG PO TABS: 10.0000 mg | ORAL_TABLET | Freq: Every day | ORAL | 1 refills | Status: AC

## 2024-03-08 MED ORDER — ATENOLOL 50 MG PO TABS: 50.0000 mg | ORAL_TABLET | Freq: Every day | ORAL | 1 refills | Status: AC

## 2024-03-08 MED ORDER — ESCITALOPRAM OXALATE 10 MG PO TABS: 10.0000 mg | ORAL_TABLET | Freq: Every day | ORAL | 1 refills | Status: AC

## 2024-03-08 MED ORDER — PANTOPRAZOLE SODIUM 20 MG PO TBEC: 20.0000 mg | DELAYED_RELEASE_TABLET | Freq: Every day | ORAL | 1 refills | Status: AC

## 2024-03-08 NOTE — Progress Notes (Signed)
 Name: Suzanne Griffin   MRN: [id number]    DOB: 09-16-1965   Date:03/08/2024       Progress Note  Subjective  Chief Complaint  Chief Complaint  Patient presents with   Medical Management of Chronic Issues   Discussed the use of AI scribe software for clinical note transcription with the patient, who gave verbal consent to proceed.  History of Present Illness Suzanne Griffin is a 58 year old female with pulmonary fibrosis who presents for follow-up of her condition.  She was diagnosed with pulmonary fibrosis after a significant illness during the summer, which included pneumonia. She experiences a persistent dry cough and throat dryness, which she attributes to her medication. Shortness of breath occurs primarily during physical exertion, but not during work activities. A CT scan and pulmonary function tests were performed, with a total lung capacity of 81% and an FEV1 of 67%. She is currently on methotrexate, folic acid, and sulfamethoxazole.  Her family history is significant for pulmonary fibrosis, with her mother and two of her mother's siblings having had the condition. One cousin underwent a lung transplant two years ago. She has not undergone genetic testing for this condition.  She manages hypertension with atenolol  and hyperlipidemia with atorvastatin  10 mg. No chest pain is reported, and there are no significant issues with her blood pressure medication. She is due for a cholesterol level check.  She has a history of prediabetes but reports no symptoms such as increased hunger or thirst. For reflux, she takes protoprolol once a day and feels she is managing well.  She has experienced anxiety and depression, particularly after her diagnosis, but finds motivation in her family, including her nine-month-old granddaughter. She takes Lexapro  for anxiety and depression and has Xanax  available but uses it sparingly.  Her social history includes a recent weight gain, which she attributes  to her diet, particularly bread consumption. She is aware of her BMI being over 35 and is considering dietary changes. She is actively involved in her family life, especially with her granddaughter.    Patient Active Problem List   Diagnosis Date Noted   Latent tuberculosis by blood test 11/19/2023   SOB (shortness of breath) 10/14/2023   Prediabetes 09/11/2022   Perennial allergic rhinitis with seasonal variation 08/23/2021   PTSD (post-traumatic stress disorder) 08/23/2021   Mild recurrent major depression 02/16/2021   GAD (generalized anxiety disorder) 02/16/2021   Morbid obesity (HCC) 12/24/2017   Hyperlipidemia 08/17/2015   Hypertension 07/11/2015   Vitamin D  deficiency 12/21/2014   Gastro-esophageal reflux disease without esophagitis 09/18/2014    Past Surgical History:  Procedure Laterality Date   BLADDER SURGERY     CHOLECYSTECTOMY      Family History  Problem Relation Age of Onset   Hypertension Father    Diabetes Maternal Grandmother    Breast cancer Neg Hx     Social History   Tobacco Use   Smoking status: Never   Smokeless tobacco: Never  Substance Use Topics   Alcohol use: No    Alcohol/week: 0.0 standard drinks of alcohol     Current Outpatient Medications:    albuterol  (VENTOLIN  HFA) 108 (90 Base) MCG/ACT inhaler, Inhale 1 puff into the lungs., Disp: , Rfl:    ALPRAZolam  (XANAX ) 0.25 MG tablet, Take 1 tablet (0.25 mg total) by mouth daily as needed for anxiety., Disp: 20 tablet, Rfl: 0   atenolol  (TENORMIN ) 50 MG tablet, Take 1 tablet (50 mg total) by mouth daily., Disp: 90 tablet,  Rfl: 1   atorvastatin  (LIPITOR) 10 MG tablet, Take 1 tablet (10 mg total) by mouth daily., Disp: 90 tablet, Rfl: 1   cholecalciferol (VITAMIN D3) 25 MCG (1000 UNIT) tablet, Take 1,000 Units by mouth daily., Disp: , Rfl:    escitalopram  (LEXAPRO ) 10 MG tablet, Take 1 tablet (10 mg total) by mouth daily., Disp: 90 tablet, Rfl: 1   folic acid (FOLVITE) 1 MG tablet, Take 1 mg by  mouth daily., Disp: , Rfl:    methotrexate (RHEUMATREX) 2.5 MG tablet, Take 10 mg by mouth once a week., Disp: , Rfl:    Multiple Vitamin (MULTIVITAMIN) tablet, Take 1 tablet by mouth daily., Disp: , Rfl:    pantoprazole  (PROTONIX ) 20 MG tablet, Take 1 tablet (20 mg total) by mouth daily., Disp: 90 tablet, Rfl: 1   sulfamethoxazole-trimethoprim (BACTRIM) 400-80 MG tablet, Take by mouth 3 (three) times a week., Disp: , Rfl:    benzonatate  (TESSALON ) 100 MG capsule, Take 1-2 capsules (100-200 mg total) by mouth 3 (three) times daily as needed. (Patient not taking: Reported on 03/08/2024), Disp: 40 capsule, Rfl: 0   promethazine-dextromethorphan (PROMETHAZINE-DM) 6.25-15 MG/5ML syrup, Take 5 mLs by mouth every 6 (six) hours as needed. (Patient not taking: Reported on 03/08/2024), Disp: , Rfl:   No Known Allergies  I personally reviewed active problem list, medication list, allergies, family history with the patient/caregiver today.   ROS  Ten systems reviewed and is negative except as mentioned in HPI    Objective Physical Exam CONSTITUTIONAL: Patient appears well-developed and well-nourished. No distress. HEENT: Head atraumatic, normocephalic, neck supple. CARDIOVASCULAR: Normal rate, regular rhythm and normal heart sounds. No murmur heard. No BLE edema. PULMONARY: Effort normal and breath sounds normal. Lungs clear to auscultation bilaterally. No respiratory distress. ABDOMINAL: There is no tenderness or distention. MUSCULOSKELETAL: Normal gait. Without gross motor or sensory deficit. PSYCHIATRIC: Patient has a normal mood and affect. Behavior is normal. Judgment and thought content normal.  Vitals:   03/08/24 0822  BP: 122/76  Pulse: 73  Resp: 16  SpO2: 94%  Weight: 193 lb 9.6 oz (87.8 kg)  Height: 5' 0.75 (1.543 m)    Body mass index is 36.88 kg/m.    PHQ2/9:    03/08/2024    8:16 AM 09/19/2023    7:58 AM 09/04/2023    9:04 AM 03/07/2023    8:01 AM 09/11/2022    3:48 PM   Depression screen PHQ 2/9  Decreased Interest 0 0 0 0 0  Down, Depressed, Hopeless 0 0 0 0 0  PHQ - 2 Score 0 0 0 0 0  Altered sleeping 0 0 0 0 1  Tired, decreased energy 0 0 0 0 1  Change in appetite 0 0 0 0 0  Feeling bad or failure about yourself  0 0 0 0 0  Trouble concentrating 0 0 0 0 0  Moving slowly or fidgety/restless 0 0 0 0 0  Suicidal thoughts 0 0 0 0 0  PHQ-9 Score 0 0  0  0  2   Difficult doing work/chores Not difficult at all Not difficult at all Not difficult at all  Not difficult at all     Data saved with a previous flowsheet row definition    phq 9 is negative  Fall Risk:    03/08/2024    8:16 AM 09/19/2023    7:57 AM 09/04/2023    9:04 AM 03/07/2023    8:00 AM 09/11/2022    3:32 PM  Fall Risk   Falls in the past year? 0 0 0 0 1  Number falls in past yr: 0 0 0 0 0  Injury with Fall? 0 0 0 0 0  Risk for fall due to : No Fall Risks No Fall Risks No Fall Risks No Fall Risks   Follow up Falls evaluation completed Falls prevention discussed;Education provided;Falls evaluation completed Falls prevention discussed;Falls evaluation completed;Education provided Falls prevention discussed      Assessment & Plan Pulmonary fibrosis Confirmed by CT scan with usual interstitial pneumonia pattern. Reduced FEV1 at 67% and total lung capacity at 81%. Methotrexate prescribed to delay progression. Discussed disease progression and infection prevention. - Continue methotrexate as prescribed. - Administered PCV 20 and flu vaccines today. - Advised to obtain RSV vaccine after turning 58. - Encouraged adherence to medication regimen. - Advised to avoid exposure to sick individuals and unnecessary hospital visits.  Essential hypertension Blood pressure managed with atenolol . - Continue atenolol  as prescribed.  Pure hypercholesterolemia Managed with atorvastatin  10 mg. Due for cholesterol lab check. - Ordered cholesterol lab test. - Continue atorvastatin  10 mg as  prescribed.  Prediabetes Slightly elevated blood sugar levels. - Ordered A1c test to monitor blood sugar levels.  Gastroesophageal reflux disease Symptoms managed with protoprolol. - Continue protoprolol once daily.  Morbid obesity BMI over 35 with co-morbidities such as HTN /dyslipidemia. Discussed dietary habits. - Encouraged reduction of bread intake and consideration of healthier bread options such as rye, sourdough, or multigrain.  Generalized anxiety disorder and major depressive disorder Patient reports improvement with Lexapro . Xanax  rarely used. - Continue Lexapro  as prescribed. - Discontinued Xanax  prescription.

## 2024-03-09 LAB — COMPREHENSIVE METABOLIC PANEL WITH GFR
AG Ratio: 1.3 (calc) (ref 1.0–2.5)
ALT: 12 U/L (ref 6–29)
AST: 19 U/L (ref 10–35)
Albumin: 4 g/dL (ref 3.6–5.1)
Alkaline phosphatase (APISO): 100 U/L (ref 37–153)
BUN: 16 mg/dL (ref 7–25)
CO2: 28 mmol/L (ref 20–32)
Calcium: 9 mg/dL (ref 8.6–10.4)
Chloride: 101 mmol/L (ref 98–110)
Creat: 0.75 mg/dL (ref 0.50–1.03)
Globulin: 3.2 g/dL (ref 1.9–3.7)
Glucose, Bld: 99 mg/dL (ref 65–99)
Potassium: 4 mmol/L (ref 3.5–5.3)
Sodium: 137 mmol/L (ref 135–146)
Total Bilirubin: 0.6 mg/dL (ref 0.2–1.2)
Total Protein: 7.2 g/dL (ref 6.1–8.1)
eGFR: 92 mL/min/1.73m2 (ref >=60)

## 2024-03-09 LAB — CBC WITH DIFFERENTIAL/PLATELET
Absolute Lymphocytes: 1730 {cells}/uL (ref 850–3900)
Absolute Monocytes: 453 {cells}/uL (ref 200–950)
Basophils Absolute: 29 {cells}/uL (ref 0–200)
Basophils Relative: 0.4 %
Eosinophils Absolute: 117 {cells}/uL (ref 15–500)
Eosinophils Relative: 1.6 %
HCT: 37.2 % (ref 35.9–46.0)
Hemoglobin: 12.5 g/dL (ref 11.7–15.5)
MCH: 33.3 pg — ABNORMAL HIGH (ref 27.0–33.0)
MCHC: 33.6 g/dL (ref 31.6–35.4)
MCV: 99.2 fL (ref 81.4–101.7)
MPV: 10 fL (ref 7.5–12.5)
Monocytes Relative: 6.2 %
Neutro Abs: 4971 {cells}/uL (ref 1500–7800)
Neutrophils Relative %: 68.1 %
Platelets: 241 Thousand/uL (ref 140–400)
RBC: 3.75 Million/uL — ABNORMAL LOW (ref 3.80–5.10)
RDW: 13.9 % (ref 11.0–15.0)
Total Lymphocyte: 23.7 %
WBC: 7.3 Thousand/uL (ref 3.8–10.8)

## 2024-03-09 LAB — VITAMIN D 25 HYDROXY (VIT D DEFICIENCY, FRACTURES): Vit D, 25-Hydroxy: 30 ng/mL (ref 30–100)

## 2024-03-09 LAB — HEMOGLOBIN A1C
Hgb A1c MFr Bld: 5.7 % — ABNORMAL HIGH (ref <5.7)
Mean Plasma Glucose: 117 mg/dL
eAG (mmol/L): 6.5 mmol/L

## 2024-03-09 LAB — LIPID PANEL
Cholesterol: 204 mg/dL — ABNORMAL HIGH (ref <200)
HDL: 62 mg/dL (ref >=50)
LDL Cholesterol (Calc): 120 mg/dL — ABNORMAL HIGH
Non-HDL Cholesterol (Calc): 142 mg/dL — ABNORMAL HIGH (ref <130)
Total CHOL/HDL Ratio: 3.3 (calc) (ref <5.0)
Triglycerides: 108 mg/dL (ref <150)

## 2024-03-09 LAB — HEPATITIS B SURFACE ANTIBODY,QUALITATIVE: Hep B S Ab: NONREACTIVE

## 2024-03-10 ENCOUNTER — Ambulatory Visit: Payer: Self-pay | Admitting: Family Medicine

## 2024-03-16 DIAGNOSIS — H5203 Hypermetropia, bilateral: Secondary | ICD-10-CM | POA: Diagnosis not present

## 2024-03-16 DIAGNOSIS — H524 Presbyopia: Secondary | ICD-10-CM | POA: Diagnosis not present

## 2024-03-24 ENCOUNTER — Inpatient Hospital Stay: Admission: RE | Admit: 2024-03-24 | Discharge: 2024-03-24 | Attending: Family Medicine | Admitting: Family Medicine

## 2024-03-24 DIAGNOSIS — M85851 Other specified disorders of bone density and structure, right thigh: Secondary | ICD-10-CM | POA: Diagnosis not present

## 2024-03-24 DIAGNOSIS — Z780 Asymptomatic menopausal state: Secondary | ICD-10-CM

## 2024-03-24 DIAGNOSIS — Z13820 Encounter for screening for osteoporosis: Secondary | ICD-10-CM | POA: Diagnosis present

## 2024-03-24 DIAGNOSIS — Z0000 Encounter for general adult medical examination without abnormal findings: Secondary | ICD-10-CM | POA: Insufficient documentation

## 2024-03-24 DIAGNOSIS — Z1231 Encounter for screening mammogram for malignant neoplasm of breast: Secondary | ICD-10-CM | POA: Insufficient documentation

## 2024-03-24 DIAGNOSIS — M85852 Other specified disorders of bone density and structure, left thigh: Secondary | ICD-10-CM | POA: Diagnosis not present

## 2024-03-29 ENCOUNTER — Other Ambulatory Visit: Payer: Self-pay | Admitting: Pulmonary Disease

## 2024-03-29 DIAGNOSIS — J849 Interstitial pulmonary disease, unspecified: Secondary | ICD-10-CM

## 2024-04-20 ENCOUNTER — Ambulatory Visit
Admission: RE | Admit: 2024-04-20 | Discharge: 2024-04-20 | Disposition: A | Discharge status: Home / Self Care | Source: Ambulatory Visit | Attending: Pulmonary Disease | Admitting: Pulmonary Disease

## 2024-04-20 DIAGNOSIS — J849 Interstitial pulmonary disease, unspecified: Secondary | ICD-10-CM

## 2024-04-27 ENCOUNTER — Other Ambulatory Visit: Payer: Self-pay | Admitting: Pulmonary Disease

## 2024-04-27 DIAGNOSIS — J84112 Idiopathic pulmonary fibrosis: Secondary | ICD-10-CM

## 2024-04-27 NOTE — Progress Notes (Signed)
 "                                DIVISION OF PULMONARY AND CRITICAL CARE MEDICINE                              FOLLOW UP ENCOUNTER     Chief complaint: Idiopathic pulmonary fibrosis (IPF)  History of Present Illness Suzanne Griffin is a 59 year old female with interstitial lung disease who presents for follow-up of her respiratory condition.  She experiences dryness in her throat, leading to a dry cough. Her breathing has been generally stable over the past six months, with occasional shortness of breath. She recalls a spell of cough before the winter, which resolved after taking mesna. No hospitalizations have occurred during this period.  Her current medication regimen includes an antibiotic taken on Monday, Wednesday, and Friday, a weekly pill on Saturdays, and a daily vitamin. She has developed a rash, described as pruritus or itching, which she manages with cortisone, although it recurs.  Her family history is significant for lung problems, with several family members affected, including her mother who has lived with a lung condition for years. Some family members have died, and one underwent a lung transplant.  She has received the flu and pneumonia vaccines but has not yet taken the RSV vaccine.   Past Medical History:   Past Medical History:  Diagnosis Date   Anxiety    Gastroesophageal reflux disease without esophagitis 09/18/2014   GERD (gastroesophageal reflux disease)    Hypertension    IBS (irritable bowel syndrome)    IDA (iron deficiency anemia)    Polyclonal gammopathy    mild, insignificant findings    Past Surgical History:   Past Surgical History:  Procedure Laterality Date   CHOLECYSTECTOMY  04/16/2003   EGD  07/26/2005   EGD  04/30/2010   No repeat per RTE   COLONOSCOPY  05/11/2010   Int hemorrhoids   EGD  02/10/2013   Esophageal function test [facility]):  Weak peristalsis w/small peristaltic defects   Colon @ [facility]   12/07/2021   Tubular adenoma/Repeat 16yrs/[facility]   CHOLECYSTECTOMY      Allergies:  No Known Allergies  Current Medications:   Prior to Admission medications  Medication Sig Taking? Last Dose  albuterol  MDI, PROVENTIL , VENTOLIN , PROAIR , HFA 90 mcg/actuation inhaler Inhale 1 Puff into the lungs every 6 (six) hours as needed for Wheezing or Shortness of Breath Yes Taking  ALPRAZolam  (XANAX ) 0.25 MG tablet Take 0.25 mg by mouth at bedtime as needed for Sleep Yes Taking  atenoloL  (TENORMIN ) 50 MG tablet Take 50 mg by mouth once daily Yes Taking  atorvastatin  (LIPITOR) 10 MG tablet Take 1 tablet by mouth once daily Yes Taking  escitalopram  oxalate (LEXAPRO ) 10 MG tablet Take 10 mg by mouth once daily Yes Taking  folic acid (FOLVITE) 1 MG tablet Take 1 tablet (1 mg total) by mouth once daily Yes Taking  methotrexate (RHEUMATREX) 2.5 MG tablet TAKE 4 TABLETS BY MOUTH EVERY 7 DAYS. Yes Taking  multivitamin tablet Take 1 tablet by mouth once daily Yes Taking  pantoprazole  (PROTONIX ) 40 MG DR tablet TAKE 1 TABLET (40 MG TOTAL) BY MOUTH 2 (TWO) TIMES DAILY. Patient taking differently: Take 20 mg by mouth once daily Yes Taking  sulfamethoxazole-trimethoprim (BACTRIM SS) 400-80 mg tablet Take 1 tablet (80 mg of  trimethoprim total) by mouth every Monday, Wednesday, and Friday for 91 days      Family History:   Family History  Problem Relation Name Age of Onset   Osteoporosis (Thinning of bones) Mother     Ulcerative colitis Mother     High blood pressure (Hypertension) Father     High blood pressure (Hypertension) Sister     No Known Problems Brother      Social History:   Social History   Socioeconomic History   Marital status: Married  Tobacco Use   Smoking status: Never   Smokeless tobacco: Never  Vaping Use   Vaping status: Never Used  Substance and Sexual Activity   Alcohol use: No    Alcohol/week: 0.0 standard drinks of alcohol   Drug use: No   Sexual activity:  Defer   Social Drivers of Health   Financial Resource Strain: Medium Risk (09/24/2023)   Overall Financial Resource Strain (CARDIA)    Difficulty of Paying Living Expenses: Somewhat hard  Food Insecurity: No Food Insecurity (09/24/2023)   Hunger Vital Sign    Worried About Running Out of Food in the Last Year: Never true    Ran Out of Food in the Last Year: Never true  Transportation Needs: No Transportation Needs (09/24/2023)   PRAPARE - Administrator, Civil Service (Medical): No    Lack of Transportation (Non-Medical): No  Physical Activity: Inactive (09/19/2023)   Received from [facility]   Exercise Vital Sign    On average, how many days per week do you engage in moderate to strenuous exercise (like a brisk walk)?: 0 days    On average, how many minutes do you engage in exercise at this level?: 0 min  Stress: No Stress Concern Present (09/19/2023)   Received from [facility] - Occupational Stress Questionnaire    Feeling of Stress : Only a little  Social Connections: Moderately Integrated (09/19/2023)   Received from [facility]   Social Connection and Isolation Panel    In a typical week, how many times do you talk on the phone with family, friends, or neighbors?: More than three times a week    How often do you get together with friends or relatives?: Once a week    How often do you attend church or religious services?: More than 4 times per year    Do you belong to any clubs or organizations such as church groups, unions, fraternal or athletic groups, or school groups?: No    How often do you attend meetings of the clubs or organizations you belong to?: Never    Are you married, widowed, divorced, separated, never married, or living with a partner?: Married  Housing Stability: Low Risk  (09/24/2023)   Housing Stability Vital Sign    Unable to Pay for Housing in the Last Year: No    Number of Times Moved in the  Last Year: 0    Homeless in the Last Year: No    Review of Systems:   A 10 point review of systems is negative, except for the pertinent positives and negatives detailed in the HPI.  Vitals:   Vitals:   04/27/24 0847  BP: (!) 147/78  BP Location: Right upper arm  Patient Position: Sitting  BP Cuff Size: Large Adult  Pulse: 69  SpO2: 94%  Weight: 90.4 kg (199 lb 3.2 oz)  Height: 157.5 cm (5' 2)  Body mass index is 36.43 kg/m.  Physical Exam:   Physical Exam Vitals and nursing note reviewed.  Constitutional:      General: in no acute distress.    Appearance: Normal appearance. Is not ill-appearing, toxic-appearing or diaphoretic.  HENT:     Head: Normocephalic and atraumatic.     Right Ear: External ear normal.     Left Ear: External ear normal.  Eyes:     General:        Right eye: No discharge.        Left eye: No discharge.     Extraocular Movements: Extraocular movements intact.     Pupils: Pupils are equal, round, and reactive to light.  Cardiovascular:     Rate and Rhythm: Normal rate and regular rhythm.     Pulses: Normal pulses.     Heart sounds: Normal heart sounds. No murmur heard.    No friction rub. No gallop.  Abdominal:     General: Bowel sounds are normal.  Skin:    General: Skin is warm and dry.     Capillary Refill: Capillary refill takes less than 2 seconds.  Neurological:     Mental Status: Patient is alert.     Lab and Imaging Results:   Results Labs CRP: Elevated  Radiology Chest CT (04/20/2024): Bilateral pulmonary scarring with decreased parenchymal inflammation compared to prior imaging; interval improvement in left lung and lower lung zones. (Independently interpreted) Chest CT (09/2023): Bilateral pulmonary scarring, more extensive in right lung; parenchymal inflammation present. (Independently interpreted)  Diagnostic Pulmonary function testing: Forced vital capacity approximately 65-67% predicted on prior  testing.    Assessment and Plan:   Diagnoses and all orders for this visit:  ILD (interstitial lung disease) (CMS/HHS-HCC) -     C-Reactive Protein, Quant - [facility] -     Cancel: Sedimentation Rate-Automated -     sulfamethoxazole-trimethoprim (BACTRIM SS) 400-80 mg tablet; Take 1 tablet (80 mg of trimethoprim total) by mouth every Monday, Wednesday, and Friday for 91 days -     Sedimentation Rate-Westergren - [facility]  IPF (idiopathic pulmonary fibrosis) (CMS/HHS-HCC) -     C-Reactive Protein, Quant - [facility] -     Cancel: Sedimentation Rate-Automated -     sulfamethoxazole-trimethoprim (BACTRIM SS) 400-80 mg tablet; Take 1 tablet (80 mg of trimethoprim total) by mouth every Monday, Wednesday, and Friday for 91 days -     Sedimentation Rate-Westergren - [facility]    Assessment & Plan Idiopathic pulmonary fibrosis Chronic condition with scarring in the lungs. Recent CT scan shows less inflammation and signs of improvement compared to June. Breathing tests previously showed 65-67% lung function. High CRP indicates ongoing inflammation. Family history of lung problems. Risk of pneumonia, especially fungal pneumonia, due to scarring. - Continue current antibiotic regimen as it targets both bacteria and fungi, reducing pneumonia risk. - Monitor for rash; discontinue antibiotic if rash worsens. - Ordered repeat blood work to assess inflammation levels. - Ordered repeat breathing test in a few months. - Recommended RSV vaccine. - Discussed genetic testing for potential genetic condition due to family history. -Autoimmune workup has been done which was negative for autoantibiodies, connective tissue disease , ANCA but did have profoundly elevated inflammatory biomarkers including CRP and ESR  Drug-induced skin eruption Intermittent rash and pruritus, possibly related to antibiotic use. Rash improves with cortisone but recurs. Antibiotic known to cause rash in 1% of patients. - Monitor rash;  discontinue antibiotic if rash worsens.    I spent  41 minutes in both face-to-face and non-face-to-face activities including reviewing history, performing an exam and evaluation, entering clinical information EHR, interpreting results, counseling patient/family/caregiver, reviewing x-rays/CT scans/MRI/labs/echocardiography/PFTs, ordering meds/tests/procedures, referring and communicating with consulting healthcare professionals and care coordination. This does not include time spent with staff.    The patient and/or family voices understanding of the plans. All questions and concerns were answered. The patient and /or family was instructed to call if the patient needs to be seen sooner. Thank you for allowing me to participate in the care of this patient. Do not hesitate to contact me via Epic or by calling our office at [phone] with any questions or concerns.     This note has been created using dictation software tool and any typographical errors are purely unintentional.  Patient received an After Visit Summary     "

## 2024-04-27 NOTE — Addendum Note (Signed)
 Addended by: PENNSTROM, JORDYN on: 04/27/2024 02:56 PM   Modules accepted: Orders

## 2024-09-10 ENCOUNTER — Ambulatory Visit: Admitting: Family Medicine

## 2024-09-24 ENCOUNTER — Encounter: Admitting: Family Medicine
